# Patient Record
Sex: Male | Born: 2000 | Race: Black or African American | Hispanic: No | Marital: Single | Smoking: Current every day smoker
Health system: Southern US, Community
[De-identification: ages and names within clinical notes are randomized; demographics above are authoritative.]

## PROBLEM LIST (undated history)

## (undated) DIAGNOSIS — S21331A Puncture wound without foreign body of right front wall of thorax with penetration into thoracic cavity, initial encounter: Secondary | ICD-10-CM

## (undated) DIAGNOSIS — F909 Attention-deficit hyperactivity disorder, unspecified type: Secondary | ICD-10-CM

## (undated) DIAGNOSIS — A4901 Methicillin susceptible Staphylococcus aureus infection, unspecified site: Secondary | ICD-10-CM

## (undated) HISTORY — DX: Puncture wound without foreign body of right front wall of thorax with penetration into thoracic cavity, initial encounter: S21.331A

## (undated) HISTORY — DX: Methicillin susceptible Staphylococcus aureus infection, unspecified site: A49.01

## (undated) HISTORY — PX: ADENOIDECTOMY: SUR15

---
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
## 2018-03-01 DIAGNOSIS — Z5181 Encounter for therapeutic drug level monitoring: Secondary | ICD-10-CM | POA: Diagnosis not present

## 2018-03-03 DIAGNOSIS — Z5181 Encounter for therapeutic drug level monitoring: Secondary | ICD-10-CM | POA: Diagnosis not present

## 2018-03-08 DIAGNOSIS — Z5181 Encounter for therapeutic drug level monitoring: Secondary | ICD-10-CM | POA: Diagnosis not present

## 2018-03-10 DIAGNOSIS — Z5181 Encounter for therapeutic drug level monitoring: Secondary | ICD-10-CM | POA: Diagnosis not present

## 2018-03-16 DIAGNOSIS — Z5181 Encounter for therapeutic drug level monitoring: Secondary | ICD-10-CM | POA: Diagnosis not present

## 2018-03-18 DIAGNOSIS — Z5181 Encounter for therapeutic drug level monitoring: Secondary | ICD-10-CM | POA: Diagnosis not present

## 2018-03-22 DIAGNOSIS — Z5181 Encounter for therapeutic drug level monitoring: Secondary | ICD-10-CM | POA: Diagnosis not present

## 2018-03-24 DIAGNOSIS — Z5181 Encounter for therapeutic drug level monitoring: Secondary | ICD-10-CM | POA: Diagnosis not present

## 2018-03-30 DIAGNOSIS — Z5181 Encounter for therapeutic drug level monitoring: Secondary | ICD-10-CM | POA: Diagnosis not present

## 2018-04-01 DIAGNOSIS — Z5181 Encounter for therapeutic drug level monitoring: Secondary | ICD-10-CM | POA: Diagnosis not present

## 2018-04-05 DIAGNOSIS — Z5181 Encounter for therapeutic drug level monitoring: Secondary | ICD-10-CM | POA: Diagnosis not present

## 2018-04-06 IMAGING — CR DG HAND COMPLETE 3+V*R*
3 series · 3 of 3 positions shown · non-contrast
Comparison: None.

CLINICAL DATA: Pain in the right hand, fifth metacarpal region

EXAM:
RIGHT HAND - COMPLETE 3+ VIEW

[x hand pa left]
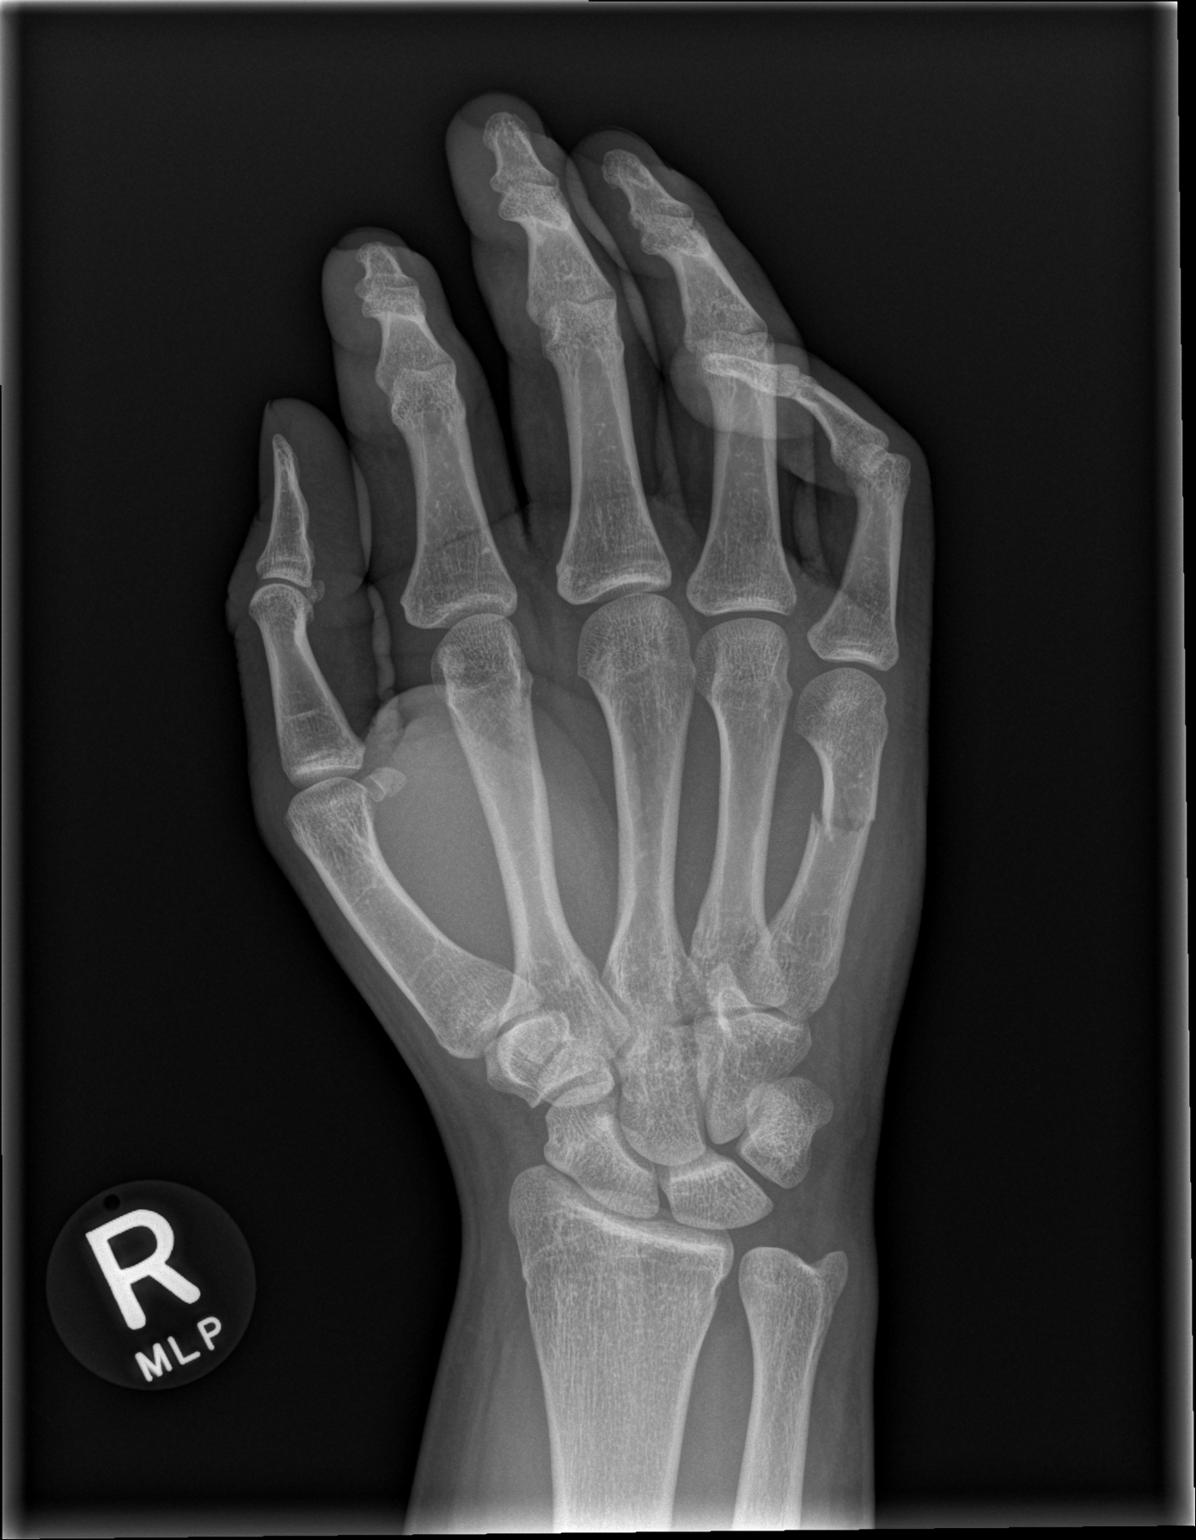

[x hand obl left]
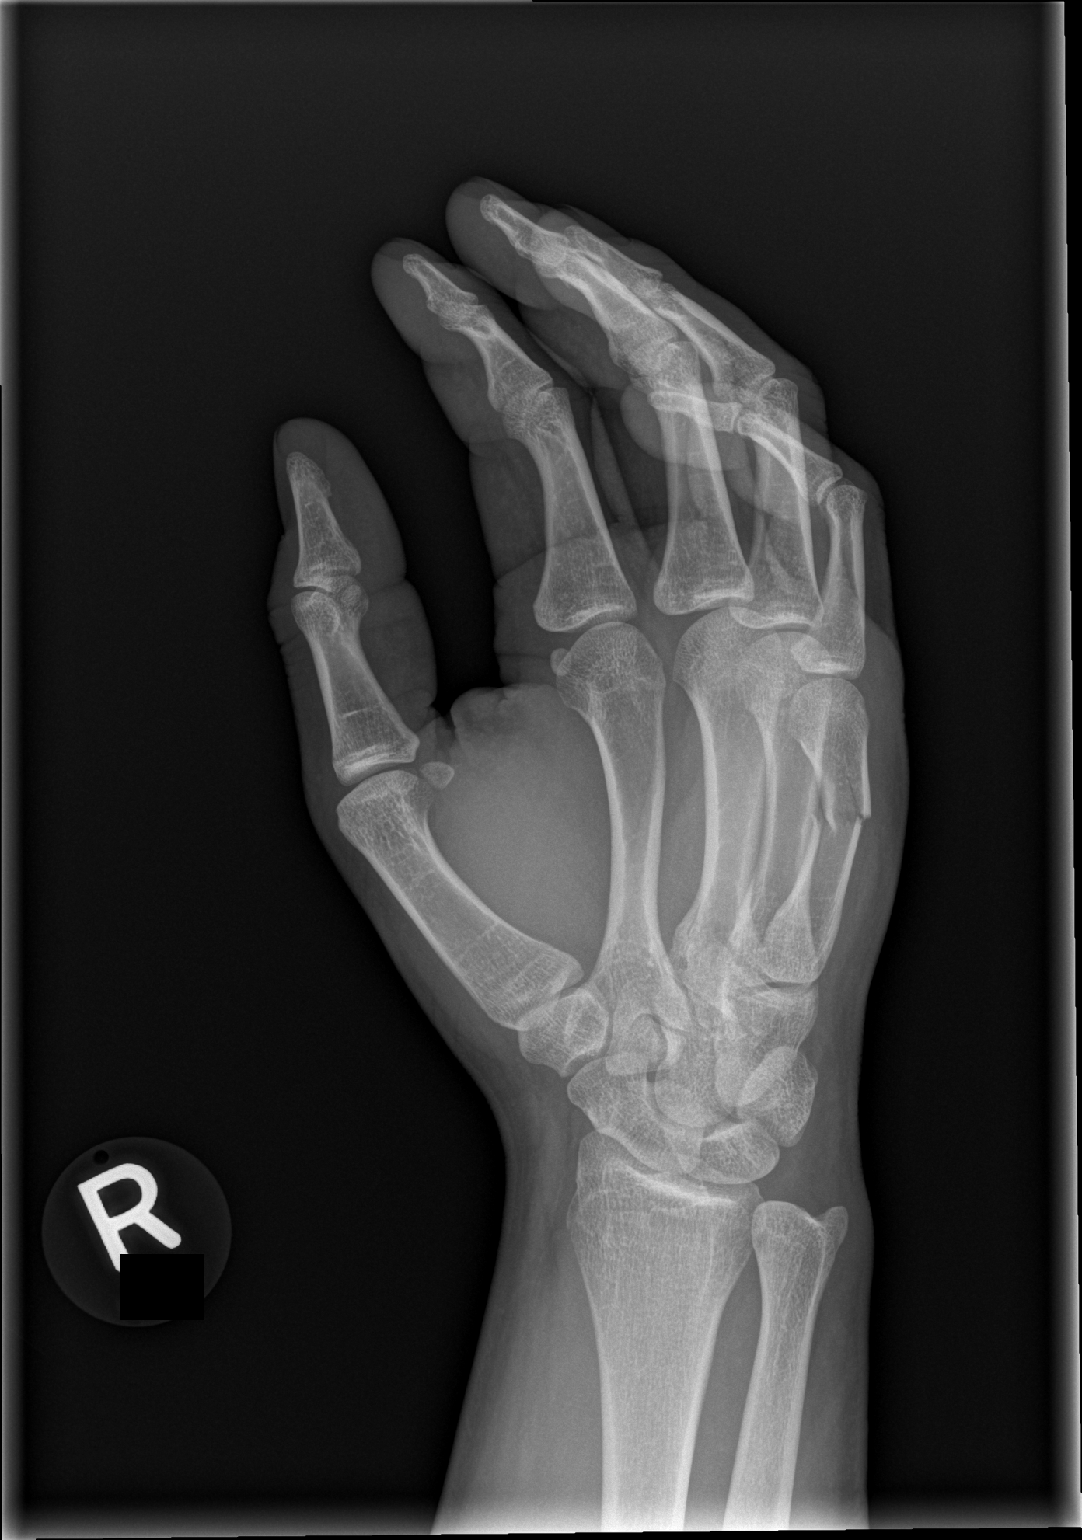

[x hand lat left]
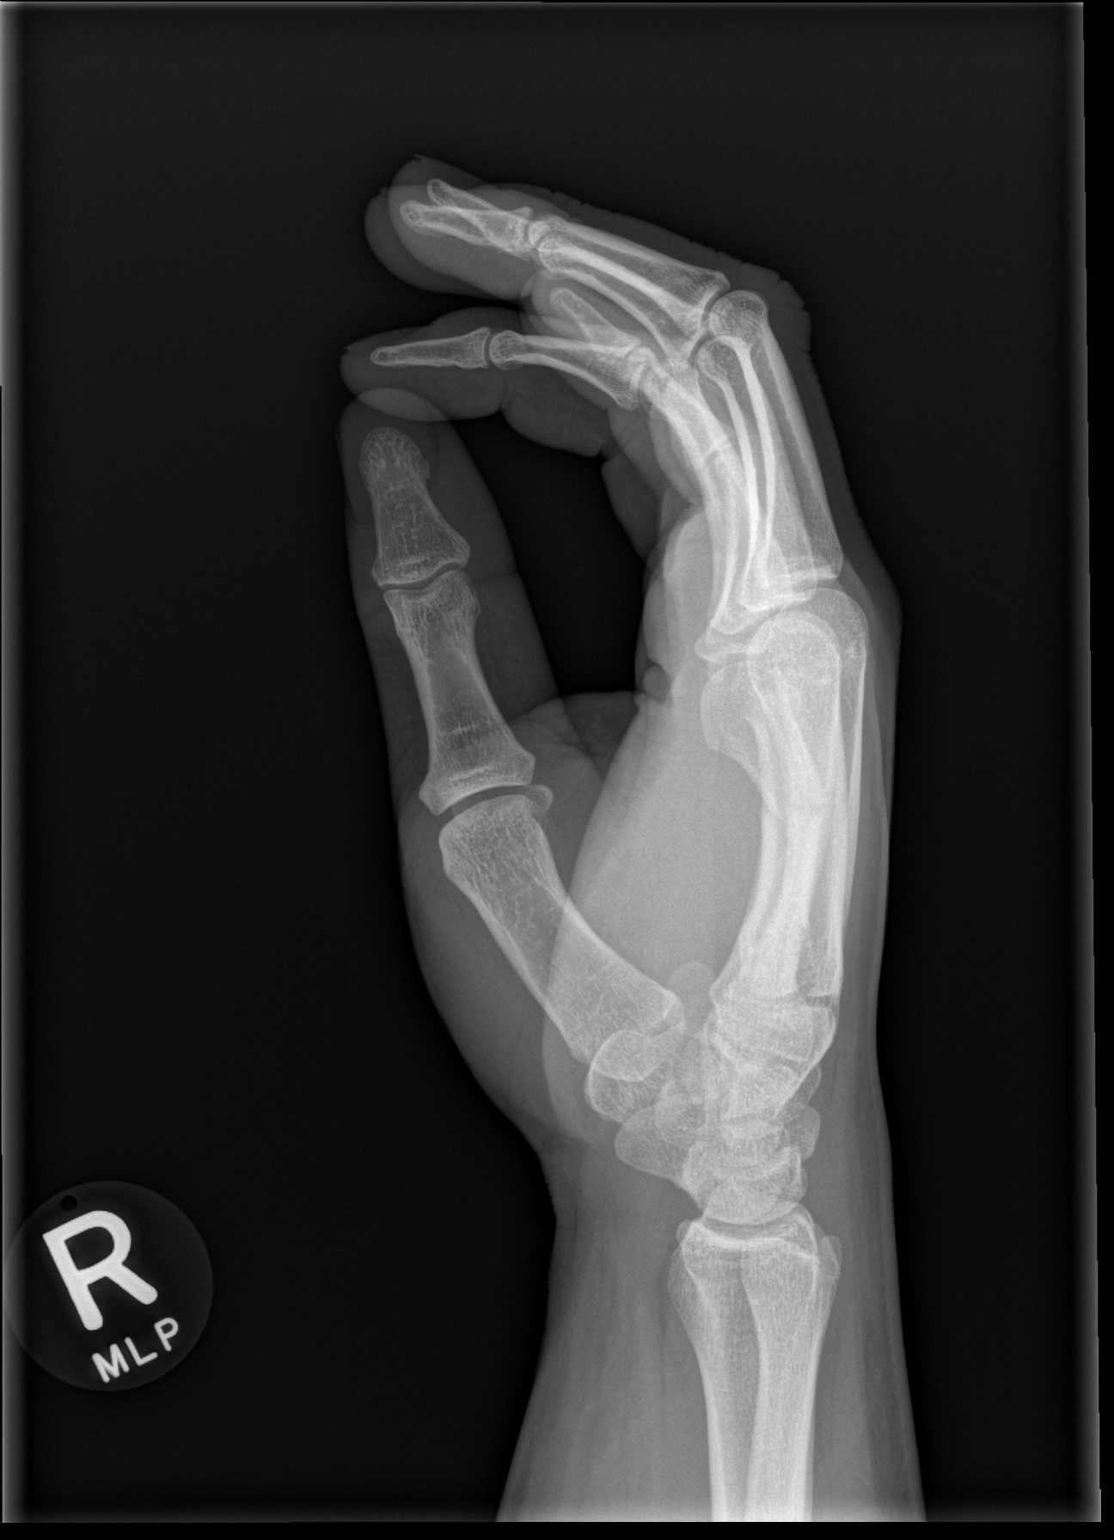

[3 of 3 positions shown; findings below may reference images not displayed]

FINDINGS: There is an acute fracture involving the midshaft of the fifth
metacarpal. This demonstrates mild volar angulation of the distal
fracture fragment. Less than [DATE] shaft diameter of displacement of
distal fracture fragment in an ulnar direction.
IMPRESSION: Mildly displaced and angulated fracture involving the midshaft of
the fifth metacarpal

## 2018-04-07 DIAGNOSIS — Z5181 Encounter for therapeutic drug level monitoring: Secondary | ICD-10-CM | POA: Diagnosis not present

## 2018-04-12 DIAGNOSIS — Z5181 Encounter for therapeutic drug level monitoring: Secondary | ICD-10-CM | POA: Diagnosis not present

## 2018-04-14 DIAGNOSIS — Z5181 Encounter for therapeutic drug level monitoring: Secondary | ICD-10-CM | POA: Diagnosis not present

## 2018-04-19 DIAGNOSIS — Z5181 Encounter for therapeutic drug level monitoring: Secondary | ICD-10-CM | POA: Diagnosis not present

## 2018-04-21 DIAGNOSIS — Z5181 Encounter for therapeutic drug level monitoring: Secondary | ICD-10-CM | POA: Diagnosis not present

## 2018-04-26 DIAGNOSIS — Z5181 Encounter for therapeutic drug level monitoring: Secondary | ICD-10-CM | POA: Diagnosis not present

## 2018-04-28 DIAGNOSIS — Z5181 Encounter for therapeutic drug level monitoring: Secondary | ICD-10-CM | POA: Diagnosis not present

## 2018-05-17 DIAGNOSIS — Z5181 Encounter for therapeutic drug level monitoring: Secondary | ICD-10-CM | POA: Diagnosis not present

## 2018-05-19 DIAGNOSIS — Z5181 Encounter for therapeutic drug level monitoring: Secondary | ICD-10-CM | POA: Diagnosis not present

## 2018-07-07 DIAGNOSIS — J329 Chronic sinusitis, unspecified: Secondary | ICD-10-CM | POA: Diagnosis not present

## 2018-07-07 DIAGNOSIS — Z113 Encounter for screening for infections with a predominantly sexual mode of transmission: Secondary | ICD-10-CM | POA: Diagnosis not present

## 2018-07-07 DIAGNOSIS — F8081 Childhood onset fluency disorder: Secondary | ICD-10-CM | POA: Diagnosis not present

## 2018-07-15 ENCOUNTER — Ambulatory Visit: Payer: Medicaid Other | Attending: Pediatrics | Admitting: Speech Pathology

## 2018-09-04 DIAGNOSIS — H1013 Acute atopic conjunctivitis, bilateral: Secondary | ICD-10-CM | POA: Diagnosis not present

## 2019-06-27 ENCOUNTER — Ambulatory Visit (INDEPENDENT_AMBULATORY_CARE_PROVIDER_SITE_OTHER): Payer: Medicaid Other

## 2019-06-27 ENCOUNTER — Ambulatory Visit (HOSPITAL_COMMUNITY)
Admission: EM | Admit: 2019-06-27 | Discharge: 2019-06-27 | Disposition: A | Discharge status: Home / Self Care | Payer: Medicaid Other | Attending: Family Medicine | Admitting: Family Medicine

## 2019-06-27 ENCOUNTER — Encounter (HOSPITAL_COMMUNITY): Payer: Self-pay

## 2019-06-27 ENCOUNTER — Other Ambulatory Visit: Payer: Self-pay

## 2019-06-27 DIAGNOSIS — M25511 Pain in right shoulder: Secondary | ICD-10-CM

## 2019-06-27 NOTE — ED Provider Notes (Signed)
[facility]    CSN: [id number] Arrival date & time: 06/27/19  1939      History   Chief Complaint Chief Complaint  Patient presents with  . Shoulder Pain    HPI Chad Davidson is a 18 y.o. male.   He is presenting with right shoulder pain.  This is been ongoing for the past 3 days.  It got acutely worse over the past 24 hours.  He was tackling a treat in October.  He denies any dislocation.  He had full range of motion yesterday.  He has some trouble changing the brakes yesterday.  The pain is occurring over the anterior aspect of the shoulder.  No radicular symptoms.  HPI  Past Medical History:  Diagnosis Date  . Attention deficit hyperactivity disorder (ADHD)     Patient Active Problem List   Diagnosis Date Noted  . Boxer's fracture, closed, initial encounter 03/12/2017  . 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 Mandible fracture ([place]) 04/15/2013    Past Surgical History:  Procedure Laterality Date  . ADENOIDECTOMY     age 23  . CLOSED REDUCTION MANDIBLE WITH MANDIBULOMA N/A 04/15/2013   Procedure: CLOSED REDUCTION MANDIBLE WITH MANDIBULOMAXILLARY FUSION;  Surgeon: Izora Gala, MD;  Location: [facility];  Service: ENT;  Laterality: N/A;  . MANDIBULAR HARDWARE REMOVAL N/A 05/25/2013   Procedure: MANDIBULAR HARDWARE REMOVAL;  Surgeon: Izora Gala, MD;  Location: [facility];  Service: ENT;  Laterality: N/A;       Home Medications    Prior to Admission medications   Medication Sig Start Date End Date Taking? Authorizing Provider  ALPRAZolam Duanne Moron) 0.5 MG tablet Take 2 tablets approximately 45 minutes prior to the MRI study, take a third tablet if needed. 03/02/17   Kathrynn Ducking, MD  amphetamine-dextroamphetamine (ADDERALL XR) 30 MG 24 hr capsule Take 30 mg by mouth every morning.    [provider]  amphetamine-dextroamphetamine (ADDERALL) 10 MG tablet Take 10 mg by mouth daily.    [provider]  ibuprofen  (ADVIL,MOTRIN) 800 MG tablet Take 1 tablet (800 mg total) by mouth every 8 (eight) hours as needed. 03/09/17   Dalia Heading, PA-C    Family History Family History  Problem Relation Age of Onset  . Diabetes Mother   . Narcolepsy Mother   . Asthma Sister   . Hypertension Maternal Grandmother     Social History Social History   Tobacco Use  . Smoking status: Never Smoker  . Smokeless tobacco: Never Used  Substance Use Topics  . Alcohol use: No  . Drug use: Yes    Types: Marijuana    Comment: Used once since 02/18/17     Allergies   Patient has no known allergies.   Review of Systems Review of Systems  Constitutional: Negative for fever.  HENT: Negative for congestion.   Respiratory: Negative for cough.   Cardiovascular: Negative for chest pain.  Gastrointestinal: Negative for abdominal pain.  Musculoskeletal: Negative for back pain.  Neurological: Negative for weakness.  Hematological: Negative for adenopathy.  Psychiatric/Behavioral: Negative for agitation.     Physical Exam Triage Vital Signs ED Triage Vitals  Enc Vitals Group     BP 06/27/19 2001 (!) 157/78     Pulse Rate 06/27/19 2001 96     Resp 06/27/19 2001 18     Temp 06/27/19 2001 97.8 F (36.6 C)     Temp Source 06/27/19 2001 Oral     SpO2 06/27/19 2001 98 %  Weight 06/27/19 2003 230 lb (104.3 kg)     Height --      Head Circumference --      Peak Flow --      Pain Score --      Pain Loc --      Pain Edu? --      Excl. in GC? --    No data found.  Updated Vital Signs BP (!) 157/78 (BP Location: Right Arm)   Pulse 96   Temp 97.8 F (36.6 C) (Oral)   Resp 18   Wt 104.3 kg   SpO2 98%   BMI 32.08 kg/m   Visual Acuity Right Eye Distance:   Left Eye Distance:   Bilateral Distance:    Right Eye Near:   Left Eye Near:    Bilateral Near:     Physical Exam Gen: NAD, alert, cooperative with exam, well-appearing ENT: normal lips, normal nasal mucosa,  Eye: normal EOM, normal  conjunctiva and lids CV:  no edema, +2 pedal pulses   Resp: no accessory muscle use, non-labored,  GI: no masses or tenderness, no hernia  Skin: no rashes, no areas of induration  Neuro: normal tone, normal sensation to touch Psych:  normal insight, alert and oriented MSK:  Right shoulder:  Normal passive flexion and abduction  Normal IR and ER  TTP over the clavicle  Pain with ER and abduction  Pain with Empty can testing  NVI     UC Treatments / Results  Labs (all labs ordered are listed, but only abnormal results are displayed) Labs Reviewed - No data to display  EKG   Radiology Dg Shoulder Right  Result Date: 06/27/2019 CLINICAL DATA:  18 year old male with right shoulder pain. EXAM: RIGHT SHOULDER - 2+ VIEW COMPARISON:  None. FINDINGS: There is no acute fracture or dislocation. The bones are well mineralized. No significant degenerative changes. Faint tiny lucencies in the right shoulder joint noted which may be related to recent injection or represent cartilaginous degeneration. MRI may provide better evaluation if clinically indicated. IMPRESSION: 1. No acute fracture or dislocation. 2. Tiny lucencies in the right shoulder joint may be related to recent injection or represent cartilaginous degeneration. Clinical correlation is recommended. Electronically Signed   By: Elgie Collard M.D.   On: 06/27/2019 20:52    Procedures Procedures (including critical care time)  Medications Ordered in UC Medications - No data to display  Initial Impression / Assessment and Plan / UC Course  I have reviewed the triage vital signs and the nursing notes.  Pertinent labs & imaging results that were available during my care of the patient were reviewed by me and considered in my medical decision making (see chart for details).     [name] is a 18 yo M that is presenting with right shoulder pain. Concern for labral issue with tackling a tree. Less likely for dislocation. Imaging  showed no fracture. Will place in sling. Counseled on supportive care and given indications to follow up.   Final Clinical Impressions(s) / UC Diagnoses   Final diagnoses:  Acute pain of right shoulder     Discharge Instructions     Please try the sling  Please try ibuprofen for pain  Please use ice Please follow up with sports medicine     ED Prescriptions    None     PDMP not reviewed this encounter.   Myra Rude, MD 06/27/19 2112

## 2019-06-27 NOTE — ED Triage Notes (Signed)
Pt states he push a tree down today. Pt was outside today pushing down  a old rotten tree today and injured his right shoulder.

## 2019-06-27 NOTE — Discharge Instructions (Signed)
Please try the sling  Please try ibuprofen for pain  Please use ice Please follow up with sports medicine

## 2019-08-19 ENCOUNTER — Emergency Department (HOSPITAL_COMMUNITY): Payer: Medicaid Other

## 2019-08-19 ENCOUNTER — Encounter (HOSPITAL_COMMUNITY): Payer: Self-pay

## 2019-08-19 ENCOUNTER — Emergency Department (HOSPITAL_COMMUNITY)
Admission: EM | Admit: 2019-08-19 | Discharge: 2019-08-19 | Disposition: A | Discharge status: Home / Self Care | Payer: Medicaid Other | Attending: Emergency Medicine | Admitting: Emergency Medicine

## 2019-08-19 ENCOUNTER — Other Ambulatory Visit: Payer: Self-pay

## 2019-08-19 DIAGNOSIS — S61001A Unspecified open wound of right thumb without damage to nail, initial encounter: Secondary | ICD-10-CM | POA: Diagnosis not present

## 2019-08-19 DIAGNOSIS — Y939 Activity, unspecified: Secondary | ICD-10-CM | POA: Diagnosis not present

## 2019-08-19 DIAGNOSIS — S81002A Unspecified open wound, left knee, initial encounter: Secondary | ICD-10-CM | POA: Diagnosis not present

## 2019-08-19 DIAGNOSIS — Y999 Unspecified external cause status: Secondary | ICD-10-CM | POA: Diagnosis not present

## 2019-08-19 DIAGNOSIS — T07XXXA Unspecified multiple injuries, initial encounter: Secondary | ICD-10-CM | POA: Insufficient documentation

## 2019-08-19 DIAGNOSIS — S299XXA Unspecified injury of thorax, initial encounter: Secondary | ICD-10-CM | POA: Diagnosis not present

## 2019-08-19 DIAGNOSIS — Y929 Unspecified place or not applicable: Secondary | ICD-10-CM | POA: Insufficient documentation

## 2019-08-19 DIAGNOSIS — W3400XA Accidental discharge from unspecified firearms or gun, initial encounter: Secondary | ICD-10-CM | POA: Insufficient documentation

## 2019-08-19 DIAGNOSIS — F17210 Nicotine dependence, cigarettes, uncomplicated: Secondary | ICD-10-CM | POA: Diagnosis not present

## 2019-08-19 DIAGNOSIS — F1210 Cannabis abuse, uncomplicated: Secondary | ICD-10-CM | POA: Diagnosis not present

## 2019-08-19 DIAGNOSIS — Z23 Encounter for immunization: Secondary | ICD-10-CM | POA: Insufficient documentation

## 2019-08-19 DIAGNOSIS — S60351A Superficial foreign body of right thumb, initial encounter: Secondary | ICD-10-CM | POA: Insufficient documentation

## 2019-08-19 DIAGNOSIS — S61432A Puncture wound without foreign body of left hand, initial encounter: Secondary | ICD-10-CM | POA: Diagnosis not present

## 2019-08-19 DIAGNOSIS — S21131A Puncture wound without foreign body of right front wall of thorax without penetration into thoracic cavity, initial encounter: Secondary | ICD-10-CM | POA: Diagnosis not present

## 2019-08-19 DIAGNOSIS — S81032A Puncture wound without foreign body, left knee, initial encounter: Secondary | ICD-10-CM | POA: Diagnosis not present

## 2019-08-19 MED ORDER — TETANUS-DIPHTH-ACELL PERTUSSIS 5-2.5-18.5 LF-MCG/0.5 IM SUSP
0.5000 mL | Freq: Once | INTRAMUSCULAR | Status: AC
  Administered 2019-08-19: 22:00:00 0.5 mL via INTRAMUSCULAR
  Filled 2019-08-19: qty 0.5

## 2019-08-19 MED ORDER — CEPHALEXIN 500 MG PO CAPS: 500.0000 mg | ORAL_CAPSULE | Freq: Four times a day (QID) | ORAL | 0 refills | Status: DC

## 2019-08-19 MED ORDER — IBUPROFEN 800 MG PO TABS: 800.0000 mg | ORAL_TABLET | Freq: Three times a day (TID) | ORAL | 0 refills | Status: DC | PRN
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
## 2019-08-19 NOTE — ED Notes (Addendum)
[employer] at bedside 

## 2019-08-19 NOTE — ED Notes (Signed)
Pt was verbalized discharge instructions. PT had no further questions at this time. NAD. 

## 2019-09-16 ENCOUNTER — Encounter (HOSPITAL_COMMUNITY): Payer: Self-pay | Admitting: Emergency Medicine

## 2019-09-16 ENCOUNTER — Other Ambulatory Visit: Payer: Self-pay

## 2019-09-16 ENCOUNTER — Inpatient Hospital Stay (HOSPITAL_COMMUNITY)
Admission: EM | Admit: 2019-09-16 | Discharge: 2019-09-19 | DRG: 541 | Disposition: A | Discharge status: Home Health | Payer: Medicaid Other | Attending: Internal Medicine | Admitting: Internal Medicine

## 2019-09-16 ENCOUNTER — Emergency Department (HOSPITAL_COMMUNITY): Payer: Medicaid Other

## 2019-09-16 DIAGNOSIS — M795 Residual foreign body in soft tissue: Secondary | ICD-10-CM | POA: Diagnosis not present

## 2019-09-16 DIAGNOSIS — F17200 Nicotine dependence, unspecified, uncomplicated: Secondary | ICD-10-CM

## 2019-09-16 DIAGNOSIS — F17290 Nicotine dependence, other tobacco product, uncomplicated: Secondary | ICD-10-CM | POA: Diagnosis present

## 2019-09-16 DIAGNOSIS — M869 Osteomyelitis, unspecified: Principal | ICD-10-CM | POA: Diagnosis present

## 2019-09-16 DIAGNOSIS — W3400XA Accidental discharge from unspecified firearms or gun, initial encounter: Secondary | ICD-10-CM

## 2019-09-16 DIAGNOSIS — F909 Attention-deficit hyperactivity disorder, unspecified type: Secondary | ICD-10-CM | POA: Diagnosis present

## 2019-09-16 DIAGNOSIS — B9561 Methicillin susceptible Staphylococcus aureus infection as the cause of diseases classified elsewhere: Secondary | ICD-10-CM | POA: Diagnosis present

## 2019-09-16 DIAGNOSIS — S6991XA Unspecified injury of right wrist, hand and finger(s), initial encounter: Secondary | ICD-10-CM | POA: Diagnosis not present

## 2019-09-16 DIAGNOSIS — A4901 Methicillin susceptible Staphylococcus aureus infection, unspecified site: Secondary | ICD-10-CM

## 2019-09-16 DIAGNOSIS — S61101A Unspecified open wound of right thumb with damage to nail, initial encounter: Secondary | ICD-10-CM | POA: Diagnosis not present

## 2019-09-16 DIAGNOSIS — S21331A Puncture wound without foreign body of right front wall of thorax with penetration into thoracic cavity, initial encounter: Secondary | ICD-10-CM

## 2019-09-16 DIAGNOSIS — Z03818 Encounter for observation for suspected exposure to other biological agents ruled out: Secondary | ICD-10-CM | POA: Diagnosis not present

## 2019-09-16 DIAGNOSIS — M868X4 Other osteomyelitis, hand: Secondary | ICD-10-CM | POA: Diagnosis not present

## 2019-09-16 DIAGNOSIS — L98499 Non-pressure chronic ulcer of skin of other sites with unspecified severity: Secondary | ICD-10-CM | POA: Diagnosis present

## 2019-09-16 DIAGNOSIS — Z20822 Contact with and (suspected) exposure to covid-19: Secondary | ICD-10-CM | POA: Diagnosis present

## 2019-09-16 HISTORY — DX: Osteomyelitis, unspecified: M86.9

## 2019-09-16 LAB — CBC WITH DIFFERENTIAL/PLATELET
Abs Immature Granulocytes: 0.02 10*3/uL (ref 0.00–0.07)
Basophils Absolute: 0 10*3/uL (ref 0.0–0.1)
Basophils Relative: 1 %
Eosinophils Absolute: 0.2 10*3/uL (ref 0.0–0.5)
Eosinophils Relative: 2 %
HCT: 41.9 % (ref 39.0–52.0)
Hemoglobin: 13.6 g/dL (ref 13.0–17.0)
Immature Granulocytes: 0 %
Lymphocytes Relative: 45 %
Lymphs Abs: 3.6 10*3/uL (ref 0.7–4.0)
MCH: 26.7 pg (ref 26.0–34.0)
MCHC: 32.5 g/dL (ref 30.0–36.0)
MCV: 82.2 fL (ref 80.0–100.0)
Monocytes Absolute: 0.7 10*3/uL (ref 0.1–1.0)
Monocytes Relative: 9 %
Neutro Abs: 3.4 10*3/uL (ref 1.7–7.7)
Neutrophils Relative %: 43 %
Platelets: 265 10*3/uL (ref 150–400)
RBC: 5.1 MIL/uL (ref 4.22–5.81)
RDW: 14.1 % (ref 11.5–15.5)
WBC: 7.9 10*3/uL (ref 4.0–10.5)
nRBC: 0 % (ref 0.0–0.2)

## 2019-09-16 LAB — COMPREHENSIVE METABOLIC PANEL
ALT: 19 U/L (ref 0–44)
AST: 20 U/L (ref 15–41)
Albumin: 4.8 g/dL (ref 3.5–5.0)
Alkaline Phosphatase: 89 U/L (ref 38–126)
Anion gap: 11 (ref 5–15)
BUN: 9 mg/dL (ref 6–20)
CO2: 23 mmol/L (ref 22–32)
Calcium: 10.1 mg/dL (ref 8.9–10.3)
Chloride: 102 mmol/L (ref 98–111)
Creatinine, Ser: 0.84 mg/dL (ref 0.61–1.24)
GFR calc Af Amer: >60 mL/min (ref >60)
GFR calc non Af Amer: >60 mL/min (ref >60)
Glucose, Bld: 91 mg/dL (ref 70–99)
Potassium: 3.7 mmol/L (ref 3.5–5.1)
Sodium: 136 mmol/L (ref 135–145)
Total Bilirubin: 0.4 mg/dL (ref 0.3–1.2)
Total Protein: 8.3 g/dL — ABNORMAL HIGH (ref 6.5–8.1)

## 2019-09-16 LAB — BASIC METABOLIC PANEL
Anion gap: 12 (ref 5–15)
BUN: 9 mg/dL (ref 6–20)
CO2: 22 mmol/L (ref 22–32)
Calcium: 9.7 mg/dL (ref 8.9–10.3)
Chloride: 102 mmol/L (ref 98–111)
Creatinine, Ser: 0.84 mg/dL (ref 0.61–1.24)
GFR calc Af Amer: >60 mL/min (ref >60)
GFR calc non Af Amer: >60 mL/min (ref >60)
Glucose, Bld: 105 mg/dL — ABNORMAL HIGH (ref 70–99)
Potassium: 4.1 mmol/L (ref 3.5–5.1)
Sodium: 136 mmol/L (ref 135–145)

## 2019-09-16 LAB — C-REACTIVE PROTEIN: CRP: 1.3 mg/dL — ABNORMAL HIGH (ref <1.0)

## 2019-09-16 LAB — SEDIMENTATION RATE: Sed Rate: 8 mm/hr (ref 0–16)

## 2019-09-16 MED ORDER — ONDANSETRON HCL 4 MG/2ML IJ SOLN: 4.0000 mg | Freq: Four times a day (QID) | INTRAMUSCULAR | Status: DC | PRN

## 2019-09-16 MED ORDER — VANCOMYCIN HCL 1500 MG/300ML IV SOLN
1500.0000 mg | Freq: Two times a day (BID) | INTRAVENOUS | Status: DC
  Administered 2019-09-17 – 2019-09-19 (×5): 1500 mg via INTRAVENOUS
  Filled 2019-09-16 (×7): qty 300

## 2019-09-16 MED ORDER — MORPHINE SULFATE (PF) 4 MG/ML IV SOLN: 4.0000 mg | Freq: Once | INTRAVENOUS | Status: DC | PRN

## 2019-09-16 MED ORDER — MORPHINE SULFATE (PF) 4 MG/ML IV SOLN
4.0000 mg | Freq: Once | INTRAVENOUS | Status: AC
  Administered 2019-09-16: 4 mg via INTRAVENOUS
  Filled 2019-09-16: qty 1

## 2019-09-16 MED ORDER — ONDANSETRON HCL 4 MG PO TABS: 4.0000 mg | ORAL_TABLET | Freq: Four times a day (QID) | ORAL | Status: DC | PRN

## 2019-09-16 MED ORDER — ACETAMINOPHEN 650 MG RE SUPP: 650.0000 mg | Freq: Four times a day (QID) | RECTAL | Status: DC | PRN

## 2019-09-16 MED ORDER — ENOXAPARIN SODIUM 40 MG/0.4ML ~~LOC~~ SOLN
40.0000 mg | Freq: Every day | SUBCUTANEOUS | Status: DC
  Administered 2019-09-16: 40 mg via SUBCUTANEOUS
  Filled 2019-09-16 (×3): qty 0.4

## 2019-09-16 MED ORDER — SODIUM CHLORIDE 0.9 % IV BOLUS
500.0000 mL | Freq: Once | INTRAVENOUS | Status: AC
  Administered 2019-09-16: 500 mL via INTRAVENOUS

## 2019-09-16 MED ORDER — ACETAMINOPHEN 325 MG PO TABS: 650.0000 mg | ORAL_TABLET | Freq: Four times a day (QID) | ORAL | Status: DC | PRN

## 2019-09-16 MED ORDER — LIDOCAINE HCL (PF) 1 % IJ SOLN
5.0000 mL | Freq: Once | INTRAMUSCULAR | Status: AC
  Administered 2019-09-16: 22:00:00 5 mL
  Filled 2019-09-16: qty 30

## 2019-09-16 MED ORDER — SODIUM CHLORIDE 0.9 % IV SOLN
2.0000 g | Freq: Once | INTRAVENOUS | Status: AC
  Administered 2019-09-16: 2 g via INTRAVENOUS
  Filled 2019-09-16: qty 20

## 2019-09-16 MED ORDER — BUPIVACAINE HCL (PF) 0.5 % IJ SOLN
5.0000 mL | Freq: Once | INTRAMUSCULAR | Status: AC
  Administered 2019-09-16: 22:00:00 5 mL
  Filled 2019-09-16: qty 30

## 2019-09-16 MED ORDER — SODIUM CHLORIDE 0.9 % IV SOLN
2.0000 g | INTRAVENOUS | Status: DC
  Administered 2019-09-17: 2 g via INTRAVENOUS
  Filled 2019-09-16: qty 2

## 2019-09-16 MED ORDER — VANCOMYCIN HCL 2000 MG/400ML IV SOLN
2000.0000 mg | Freq: Once | INTRAVENOUS | Status: AC
  Administered 2019-09-16: 2000 mg via INTRAVENOUS
  Filled 2019-09-16: qty 400

## 2019-09-16 MED ORDER — TRAMADOL HCL 50 MG PO TABS
50.0000 mg | ORAL_TABLET | Freq: Four times a day (QID) | ORAL | Status: DC | PRN
  Administered 2019-09-16 – 2019-09-17 (×2): 50 mg via ORAL
  Filled 2019-09-16 (×2): qty 1

## 2019-09-16 NOTE — Progress Notes (Signed)
A consult was received from an ED physician for vanc per pharmacy dosing.  The patient's profile has been reviewed for ht/wt/allergies/indication/available labs.   A one time order has been placed for vanc 2g.  Further antibiotics/pharmacy consults should be ordered by admitting physician if indicated.                       Thank you, Berkley Harvey 09/16/2019  9:15 PM
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
## 2019-09-16 NOTE — Progress Notes (Signed)
Called by EDP, asked for advice regarding this patient's thumb condition.  I reviewed the hx, clinical photos, and xrays and recommended nail plate removal/debridement of region of involvement, obtaining cultures, and conferring with ID or patient's PCP to initiate appropriate medical management for the possible adjacent osteo.    I will plan to see him back this week for clinical re-evaluation from surgical perspective.  Neil Crouch, MD Hand Surgery Mobile [phone]
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
## 2019-09-16 NOTE — Progress Notes (Signed)
Pharmacy Antibiotic Note  Chad Davidson is a 19 y.o. male admitted on 09/16/2019 with Osteomyelitis.  Pharmacy has been consulted for Vancomycin dosing.  Plan: Vancomycin 2gm iv x1, then Vancomycin 1500 mg IV Q 12 hrs. Goal AUC 400-550. Expected AUC: 468 SCr used: 0.84 Vd: 0.5 L/kg (BMI > 30)   Height: 5\' 11"  (180.3 cm) Weight: 220 lb (99.8 kg) IBW/kg (Calculated) : 75.3  Temp (24hrs), Avg:98.5 F (36.9 C), Min:98.3 F (36.8 C), Max:98.7 F (37.1 C)  Recent Labs  Lab 09/16/19 1827 09/16/19 2304  WBC 7.9  --   CREATININE 0.84 0.84    Estimated Creatinine Clearance: 168.8 mL/min (by C-G formula based on SCr of 0.84 mg/dL).    No Known Allergies  Antimicrobials this admission: Vancomycin 09/16/2019 >> Ceftriaxone 09/16/2019 >>   Dose adjustments this admission: -  Microbiology results: -  Thank you for allowing pharmacy to be a part of this patient's care.  09/18/2019 Chad Davidson 09/16/2019 11:41 PM

## 2019-09-17 ENCOUNTER — Observation Stay: Payer: Self-pay

## 2019-09-17 DIAGNOSIS — L98499 Non-pressure chronic ulcer of skin of other sites with unspecified severity: Secondary | ICD-10-CM | POA: Diagnosis present

## 2019-09-17 DIAGNOSIS — M869 Osteomyelitis, unspecified: Secondary | ICD-10-CM | POA: Diagnosis not present

## 2019-09-17 DIAGNOSIS — S61101A Unspecified open wound of right thumb with damage to nail, initial encounter: Secondary | ICD-10-CM | POA: Diagnosis not present

## 2019-09-17 DIAGNOSIS — M795 Residual foreign body in soft tissue: Secondary | ICD-10-CM | POA: Diagnosis not present

## 2019-09-17 DIAGNOSIS — M89541 Osteolysis, right hand: Secondary | ICD-10-CM | POA: Diagnosis not present

## 2019-09-17 DIAGNOSIS — F17290 Nicotine dependence, other tobacco product, uncomplicated: Secondary | ICD-10-CM | POA: Diagnosis present

## 2019-09-17 DIAGNOSIS — W3400XA Accidental discharge from unspecified firearms or gun, initial encounter: Secondary | ICD-10-CM

## 2019-09-17 DIAGNOSIS — F909 Attention-deficit hyperactivity disorder, unspecified type: Secondary | ICD-10-CM

## 2019-09-17 DIAGNOSIS — Z03818 Encounter for observation for suspected exposure to other biological agents ruled out: Secondary | ICD-10-CM | POA: Diagnosis not present

## 2019-09-17 DIAGNOSIS — M868X4 Other osteomyelitis, hand: Secondary | ICD-10-CM | POA: Diagnosis not present

## 2019-09-17 DIAGNOSIS — S21331A Puncture wound without foreign body of right front wall of thorax with penetration into thoracic cavity, initial encounter: Secondary | ICD-10-CM

## 2019-09-17 DIAGNOSIS — B9561 Methicillin susceptible Staphylococcus aureus infection as the cause of diseases classified elsewhere: Secondary | ICD-10-CM | POA: Diagnosis not present

## 2019-09-17 DIAGNOSIS — S6991XA Unspecified injury of right wrist, hand and finger(s), initial encounter: Secondary | ICD-10-CM | POA: Diagnosis not present

## 2019-09-17 DIAGNOSIS — F17200 Nicotine dependence, unspecified, uncomplicated: Secondary | ICD-10-CM

## 2019-09-17 DIAGNOSIS — Z20822 Contact with and (suspected) exposure to covid-19: Secondary | ICD-10-CM | POA: Diagnosis present

## 2019-09-17 DIAGNOSIS — W3409XA Accidental discharge from other specified firearms, initial encounter: Secondary | ICD-10-CM | POA: Diagnosis not present

## 2019-09-17 DIAGNOSIS — S61339A Puncture wound without foreign body of unspecified finger with damage to nail, initial encounter: Secondary | ICD-10-CM | POA: Diagnosis not present

## 2019-09-17 DIAGNOSIS — Z95828 Presence of other vascular implants and grafts: Secondary | ICD-10-CM | POA: Diagnosis not present

## 2019-09-17 DIAGNOSIS — S61031A Puncture wound without foreign body of right thumb without damage to nail, initial encounter: Secondary | ICD-10-CM

## 2019-09-17 DIAGNOSIS — M86141 Other acute osteomyelitis, right hand: Secondary | ICD-10-CM | POA: Diagnosis not present

## 2019-09-17 LAB — COMPREHENSIVE METABOLIC PANEL
ALT: 17 U/L (ref 0–44)
AST: 18 U/L (ref 15–41)
Albumin: 4.8 g/dL (ref 3.5–5.0)
Alkaline Phosphatase: 84 U/L (ref 38–126)
Anion gap: 11 (ref 5–15)
BUN: 9 mg/dL (ref 6–20)
CO2: 23 mmol/L (ref 22–32)
Calcium: 9.4 mg/dL (ref 8.9–10.3)
Chloride: 104 mmol/L (ref 98–111)
Creatinine, Ser: 0.88 mg/dL (ref 0.61–1.24)
GFR calc Af Amer: >60 mL/min (ref >60)
GFR calc non Af Amer: >60 mL/min (ref >60)
Glucose, Bld: 91 mg/dL (ref 70–99)
Potassium: 3.5 mmol/L (ref 3.5–5.1)
Sodium: 138 mmol/L (ref 135–145)
Total Bilirubin: 0.6 mg/dL (ref 0.3–1.2)
Total Protein: 7.9 g/dL (ref 6.5–8.1)

## 2019-09-17 LAB — CBC
HCT: 41.1 % (ref 39.0–52.0)
Hemoglobin: 13.3 g/dL (ref 13.0–17.0)
MCH: 26.8 pg (ref 26.0–34.0)
MCHC: 32.4 g/dL (ref 30.0–36.0)
MCV: 82.7 fL (ref 80.0–100.0)
Platelets: 269 10*3/uL (ref 150–400)
RBC: 4.97 MIL/uL (ref 4.22–5.81)
RDW: 14.1 % (ref 11.5–15.5)
WBC: 8.3 10*3/uL (ref 4.0–10.5)
nRBC: 0 % (ref 0.0–0.2)

## 2019-09-17 LAB — HIV ANTIBODY (ROUTINE TESTING W REFLEX): HIV Screen 4th Generation wRfx: NONREACTIVE

## 2019-09-17 LAB — SARS CORONAVIRUS 2 (TAT 6-24 HRS): SARS Coronavirus 2: NEGATIVE

## 2019-09-17 MED ORDER — SODIUM CHLORIDE 0.9% FLUSH: 10.0000 mL | INTRAVENOUS | Status: DC | PRN

## 2019-09-17 MED ORDER — SODIUM CHLORIDE 0.9 % IV SOLN
INTRAVENOUS | Status: DC | PRN
  Administered 2019-09-17: 250 mL via INTRAVENOUS

## 2019-09-17 MED ORDER — CHLORHEXIDINE GLUCONATE CLOTH 2 % EX PADS
6.0000 | MEDICATED_PAD | Freq: Every day | CUTANEOUS | Status: DC
  Administered 2019-09-18 – 2019-09-19 (×2): 6 via TOPICAL

## 2019-09-17 MED ORDER — SODIUM CHLORIDE 0.9% FLUSH: 10.0000 mL | Freq: Two times a day (BID) | INTRAVENOUS | Status: DC
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
## 2019-09-17 NOTE — Progress Notes (Signed)
Spoke with Amy RN re PICC order for d/c home. Notified will place this pm or 09/18/19 am.  Amy RN agreeable to plan

## 2019-09-17 NOTE — Progress Notes (Signed)
Peripherally Inserted Central Catheter/Midline Placement  The IV Nurse has discussed with the patient and/or persons authorized to consent for the patient, the purpose of this procedure and the potential benefits and risks involved with this procedure.  The benefits include less needle sticks, lab draws from the catheter, and the patient may be discharged home with the catheter. Risks include, but not limited to, infection, bleeding, blood clot (thrombus formation), and puncture of an artery; nerve damage and irregular heartbeat and possibility to perform a PICC exchange if needed/ordered by physician.  Alternatives to this procedure were also discussed.  Bard Power PICC patient education guide, fact sheet on infection prevention and patient information card has been provided to patient /or left at bedside.    PICC/Midline Placement Documentation  PICC Single Lumen 09/17/19 PICC Left Brachial 48 cm 1 cm (Active)  Indication for Insertion or Continuance of Line Home intravenous therapies (PICC only) 09/17/19 1700  Exposed Catheter (cm) 1 cm 09/17/19 1700  Site Assessment Clean;Dry;Intact 09/17/19 1700  Line Status Flushed;Blood return noted;Saline locked 09/17/19 1700  Dressing Type Transparent 09/17/19 1700  Dressing Status Clean;Dry;Intact;Antimicrobial disc in place 09/17/19 1700  Line Care Connections checked and tightened 09/17/19 1700  Line Adjustment (NICU/IV Team Only) No 09/17/19 1700  Dressing Intervention New dressing 09/17/19 1700  Dressing Change Due 09/24/19 09/17/19 1700       Elliot Dally 09/17/2019, 6:17 PM

## 2019-09-17 NOTE — Progress Notes (Signed)
PHARMACY CONSULT NOTE FOR:  OUTPATIENT  PARENTERAL ANTIBIOTIC THERAPY (OPAT)  Indication: Osteomyelitis Regimen: Ceftriaxone 2gm IV q24h and Vancomycin 1500mg  IV q12h End date: 10/27/2019  IV antibiotic discharge orders are pended. To discharging provider:  please sign these orders via discharge navigator,  Select New Orders & click on the button choice - Manage This Unsigned Work.     Thank you for allowing pharmacy to be a part of this patient's care.  10/29/2019 RPh 09/17/2019, 11:36 AM

## 2019-09-18 DIAGNOSIS — A4901 Methicillin susceptible Staphylococcus aureus infection, unspecified site: Secondary | ICD-10-CM

## 2019-09-18 DIAGNOSIS — S61339A Puncture wound without foreign body of unspecified finger with damage to nail, initial encounter: Secondary | ICD-10-CM

## 2019-09-18 DIAGNOSIS — B9561 Methicillin susceptible Staphylococcus aureus infection as the cause of diseases classified elsewhere: Secondary | ICD-10-CM

## 2019-09-18 DIAGNOSIS — Z95828 Presence of other vascular implants and grafts: Secondary | ICD-10-CM

## 2019-09-18 DIAGNOSIS — W3400XA Accidental discharge from unspecified firearms or gun, initial encounter: Secondary | ICD-10-CM

## 2019-09-18 MED ORDER — CEFTRIAXONE IV (FOR PTA / DISCHARGE USE ONLY): 2.0000 g | INTRAVENOUS | 0 refills | Status: DC

## 2019-09-18 MED ORDER — VANCOMYCIN IV (FOR PTA / DISCHARGE USE ONLY): 1500.0000 mg | Freq: Two times a day (BID) | INTRAVENOUS | 0 refills | Status: DC

## 2019-09-18 MED ORDER — RAMELTEON 8 MG PO TABS
8.0000 mg | ORAL_TABLET | Freq: Once | ORAL | Status: AC
  Administered 2019-09-18: 8 mg via ORAL
  Filled 2019-09-18: qty 1

## 2019-09-18 MED ORDER — CEFAZOLIN SODIUM-DEXTROSE 2-4 GM/100ML-% IV SOLN
2.0000 g | Freq: Three times a day (TID) | INTRAVENOUS | Status: DC
  Administered 2019-09-18 – 2019-09-19 (×4): 2 g via INTRAVENOUS
  Filled 2019-09-18 (×5): qty 100

## 2019-09-18 MED ORDER — CEFAZOLIN IV (FOR PTA / DISCHARGE USE ONLY): 2.0000 g | Freq: Three times a day (TID) | INTRAVENOUS | 0 refills | Status: DC
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
## 2019-09-18 NOTE — Progress Notes (Signed)
      INFECTIOUS DISEASE ATTENDING ADDENDUM:   Date: 09/18/2019  Patient name: Chad Davidson  Medical record number: [id number]  Date of birth: [birth year]   The patient is growing Staph aureus from his cultures from his finger.  I am changing his antibiotics to vancomycin and cefazolin here.  He is going home today but I do not think he needs ceftriaxone anymore.  For simplicity I would send him home with vancomycin if we still do not know the susceptibilities of the Staph aureus.  If he subsequently grows MSSA I will call the home health infusion company and change him from vancomycin to cefazolin   Paulette Blanch Dam 09/18/2019, 10:30 AM
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
## 2019-09-18 NOTE — Progress Notes (Signed)
PHARMACY CONSULT NOTE FOR:  OUTPATIENT  PARENTERAL ANTIBIOTIC THERAPY (OPAT)  Indication: Osteomyelitis Regimen: Ancef 2gm IV q8h and Vancomycin 1500mg  IV q12h End date: 10/27/2019  IV antibiotic discharge orders are pended. To discharging provider:  please sign these orders via discharge navigator,  Select New Orders & click on the button choice - Manage This Unsigned Work.     Thank you for allowing pharmacy to be a part of this patient's care.  10/29/2019, PharmD, BCPS 09/18/2019, 10:31 AM

## 2019-09-18 NOTE — TOC Progression Note (Signed)
Transition of Care [facility]) - Progression Note    Patient Details  Name: [name] MRN: [id number] Date of Birth: 23-Feb-2001  Transition of Care [facility]) CM/SW Contact  Coralyn Helling, [place] Phone Number: 09/18/2019, 2:40 PM  Clinical Narrative:   LCSW spoke with patien'ts mother. She was wanting clarification on why patient cannot go home today. LCSW explained need for IV antibiotics and RN assistance. LCSW notified patients mother that a RN would not be available until tomorrow and that patient would be dc sent home as soon as possible in the AM. She expressed understanding and stated she would explain to patient. She stated her number was the best contact for Home health. LCSW will notify Pam.     Expected Discharge Plan: Home w Home Health Services Barriers to Discharge: Other (comment)(waiting for [facility] nurse availability for IV abx)  Expected Discharge Plan and Services Expected Discharge Plan: Home w Home Health Services   Discharge Planning Services: CM Consult Post Acute Care Choice: Home Health Living arrangements for the past 2 months: Single Family Home Expected Discharge Date: 09/18/19               DME Arranged: N/A DME Agency: NA       HH Arranged: IV Antibiotics HH Agency: [facility] Date HH Agency Contacted: 09/18/19 Time HH Agency Contacted: 1149 Representative spoke with at [facility] Agency: pam chandler   Social Determinants of Health (SDOH) Interventions    Readmission Risk Interventions No flowsheet data found.

## 2019-09-19 DIAGNOSIS — M86141 Other acute osteomyelitis, right hand: Secondary | ICD-10-CM | POA: Diagnosis not present

## 2019-09-19 MED ORDER — HEPARIN SOD (PORK) LOCK FLUSH 100 UNIT/ML IV SOLN
250.0000 [IU] | INTRAVENOUS | Status: AC | PRN
  Administered 2019-09-19: 250 [IU]
  Filled 2019-09-19: qty 2.5

## 2019-09-19 NOTE — Progress Notes (Signed)
Patient seen and examined at the bedside this morning.  Hemodynamically stable.  Stable for discharge.  Discharge orders and summary already done yesterday.  No new changes.

## 2019-09-19 NOTE — Progress Notes (Signed)
      INFECTIOUS DISEASE ATTENDING ADDENDUM:   Date: 09/19/2019  Patient name: Chad Davidson  Medical record number: [id number]  Date of birth: 08/09/2001   Patient growing MSSA and can go home on ancef alone  We will sign off for now please call with questions.   Acey Lav 09/19/2019, 1:31 PM

## 2019-09-19 NOTE — Progress Notes (Signed)
D/C instructions given to patient. Patient had no questions. NT or writer will wheel patient out once family brings the car to the front

## 2019-09-21 LAB — AEROBIC/ANAEROBIC CULTURE W GRAM STAIN (SURGICAL/DEEP WOUND)
Gram Stain: NONE SEEN
Gram Stain: NONE SEEN

## 2019-09-22 DIAGNOSIS — M86141 Other acute osteomyelitis, right hand: Secondary | ICD-10-CM | POA: Diagnosis not present

## 2019-09-22 LAB — CULTURE, BLOOD (ROUTINE X 2)
Culture: NO GROWTH
Culture: NO GROWTH
Special Requests: ADEQUATE

## 2019-09-24 DIAGNOSIS — M86141 Other acute osteomyelitis, right hand: Secondary | ICD-10-CM | POA: Diagnosis not present

## 2019-09-26 DIAGNOSIS — M869 Osteomyelitis, unspecified: Secondary | ICD-10-CM | POA: Diagnosis not present

## 2019-09-30 DIAGNOSIS — M86141 Other acute osteomyelitis, right hand: Secondary | ICD-10-CM | POA: Diagnosis not present

## 2019-10-03 DIAGNOSIS — M869 Osteomyelitis, unspecified: Secondary | ICD-10-CM | POA: Diagnosis not present

## 2019-10-06 DIAGNOSIS — M86141 Other acute osteomyelitis, right hand: Secondary | ICD-10-CM | POA: Diagnosis not present

## 2019-10-10 DIAGNOSIS — M869 Osteomyelitis, unspecified: Secondary | ICD-10-CM | POA: Diagnosis not present

## 2019-10-13 DIAGNOSIS — S60351A Superficial foreign body of right thumb, initial encounter: Secondary | ICD-10-CM | POA: Diagnosis not present

## 2019-10-13 DIAGNOSIS — M86141 Other acute osteomyelitis, right hand: Secondary | ICD-10-CM | POA: Diagnosis not present

## 2019-10-18 ENCOUNTER — Telehealth: Payer: Self-pay

## 2019-10-18 ENCOUNTER — Ambulatory Visit: Payer: Medicaid Other | Admitting: Internal Medicine

## 2019-10-18 ENCOUNTER — Other Ambulatory Visit (HOSPITAL_COMMUNITY)
Admission: RE | Admit: 2019-10-18 | Discharge: 2019-10-18 | Disposition: A | Discharge status: Home / Self Care | Payer: Medicaid Other | Source: Other Acute Inpatient Hospital | Attending: Internal Medicine | Admitting: Internal Medicine

## 2019-10-18 ENCOUNTER — Encounter: Payer: Self-pay | Admitting: Internal Medicine

## 2019-10-18 ENCOUNTER — Ambulatory Visit (INDEPENDENT_AMBULATORY_CARE_PROVIDER_SITE_OTHER): Payer: Medicaid Other | Admitting: Internal Medicine

## 2019-10-18 ENCOUNTER — Other Ambulatory Visit: Payer: Self-pay

## 2019-10-18 VITALS — BP 115/67 | HR 76 | Wt 230.0 lb

## 2019-10-18 DIAGNOSIS — Z452 Encounter for adjustment and management of vascular access device: Secondary | ICD-10-CM

## 2019-10-18 DIAGNOSIS — M869 Osteomyelitis, unspecified: Secondary | ICD-10-CM

## 2019-10-18 DIAGNOSIS — A4901 Methicillin susceptible Staphylococcus aureus infection, unspecified site: Secondary | ICD-10-CM | POA: Diagnosis not present

## 2019-10-18 HISTORY — DX: Encounter for adjustment and management of vascular access device: Z45.2

## 2019-10-18 LAB — BASIC METABOLIC PANEL
Anion gap: 8 (ref 5–15)
BUN: 9 mg/dL (ref 6–20)
CO2: 26 mmol/L (ref 22–32)
Calcium: 9.4 mg/dL (ref 8.9–10.3)
Chloride: 105 mmol/L (ref 98–111)
Creatinine, Ser: 0.95 mg/dL (ref 0.61–1.24)
GFR calc Af Amer: >60 mL/min (ref >60)
GFR calc non Af Amer: >60 mL/min (ref >60)
Glucose, Bld: 97 mg/dL (ref 70–99)
Potassium: 3.9 mmol/L (ref 3.5–5.1)
Sodium: 139 mmol/L (ref 135–145)

## 2019-10-18 LAB — CBC WITH DIFFERENTIAL/PLATELET
Abs Immature Granulocytes: 0.01 10*3/uL (ref 0.00–0.07)
Basophils Absolute: 0 10*3/uL (ref 0.0–0.1)
Basophils Relative: 1 %
Eosinophils Absolute: 0.2 10*3/uL (ref 0.0–0.5)
Eosinophils Relative: 3 %
HCT: 37.8 % — ABNORMAL LOW (ref 39.0–52.0)
Hemoglobin: 12.2 g/dL — ABNORMAL LOW (ref 13.0–17.0)
Immature Granulocytes: 0 %
Lymphocytes Relative: 45 %
Lymphs Abs: 2.4 10*3/uL (ref 0.7–4.0)
MCH: 26.6 pg (ref 26.0–34.0)
MCHC: 32.3 g/dL (ref 30.0–36.0)
MCV: 82.5 fL (ref 80.0–100.0)
Monocytes Absolute: 0.6 10*3/uL (ref 0.1–1.0)
Monocytes Relative: 11 %
Neutro Abs: 2.1 10*3/uL (ref 1.7–7.7)
Neutrophils Relative %: 40 %
Platelets: 224 10*3/uL (ref 150–400)
RBC: 4.58 MIL/uL (ref 4.22–5.81)
RDW: 14.2 % (ref 11.5–15.5)
WBC: 5.3 10*3/uL (ref 4.0–10.5)
nRBC: 0 % (ref 0.0–0.2)

## 2019-10-18 LAB — C-REACTIVE PROTEIN: CRP: 1.2 mg/dL — ABNORMAL HIGH (ref <1.0)

## 2019-10-18 LAB — SEDIMENTATION RATE: Sed Rate: 8 mm/hr (ref 0–16)

## 2019-10-18 NOTE — Assessment & Plan Note (Signed)
picc is working well, no issues.  Will have home health remove this after treatment completion.

## 2019-10-18 NOTE — Assessment & Plan Note (Signed)
He is improving well and no issues.  Inflammatory markers are normalizing well.  Will plan to complete the antibiotics as expected on 2/25 and observe off of antibiotics.  He has follow up with the hand surgeon so will not follow up here unless indicated.

## 2019-10-18 NOTE — Assessment & Plan Note (Signed)
Infection is clearing as above and on ancef.  No changes
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
## 2019-10-18 NOTE — Telephone Encounter (Signed)
Per Dr. Luciana Axe, ok to pull PICC after completion of antibiotics.Orders confirmed.   Kazimir Hartnett Loyola Mast, RN

## 2019-10-20 DIAGNOSIS — M86141 Other acute osteomyelitis, right hand: Secondary | ICD-10-CM | POA: Diagnosis not present

## 2019-10-24 ENCOUNTER — Other Ambulatory Visit (HOSPITAL_COMMUNITY)
Admission: RE | Admit: 2019-10-24 | Discharge: 2019-10-24 | Disposition: A | Discharge status: Home / Self Care | Payer: Medicaid Other | Source: Other Acute Inpatient Hospital | Attending: Internal Medicine | Admitting: Internal Medicine

## 2019-10-24 DIAGNOSIS — M869 Osteomyelitis, unspecified: Secondary | ICD-10-CM | POA: Insufficient documentation

## 2019-10-24 LAB — BASIC METABOLIC PANEL
Anion gap: 10 (ref 5–15)
BUN: 8 mg/dL (ref 6–20)
CO2: 27 mmol/L (ref 22–32)
Calcium: 9.5 mg/dL (ref 8.9–10.3)
Chloride: 104 mmol/L (ref 98–111)
Creatinine, Ser: 0.9 mg/dL (ref 0.61–1.24)
GFR calc Af Amer: >60 mL/min (ref >60)
GFR calc non Af Amer: >60 mL/min (ref >60)
Glucose, Bld: 91 mg/dL (ref 70–99)
Potassium: 4 mmol/L (ref 3.5–5.1)
Sodium: 141 mmol/L (ref 135–145)

## 2019-10-24 LAB — CBC WITH DIFFERENTIAL/PLATELET
Abs Immature Granulocytes: 0.01 10*3/uL (ref 0.00–0.07)
Basophils Absolute: 0 10*3/uL (ref 0.0–0.1)
Basophils Relative: 1 %
Eosinophils Absolute: 0.2 10*3/uL (ref 0.0–0.5)
Eosinophils Relative: 3 %
HCT: 38.6 % — ABNORMAL LOW (ref 39.0–52.0)
Hemoglobin: 12.4 g/dL — ABNORMAL LOW (ref 13.0–17.0)
Immature Granulocytes: 0 %
Lymphocytes Relative: 48 %
Lymphs Abs: 2.5 10*3/uL (ref 0.7–4.0)
MCH: 26.5 pg (ref 26.0–34.0)
MCHC: 32.1 g/dL (ref 30.0–36.0)
MCV: 82.5 fL (ref 80.0–100.0)
Monocytes Absolute: 0.5 10*3/uL (ref 0.1–1.0)
Monocytes Relative: 9 %
Neutro Abs: 2.1 10*3/uL (ref 1.7–7.7)
Neutrophils Relative %: 39 %
Platelets: 229 10*3/uL (ref 150–400)
RBC: 4.68 MIL/uL (ref 4.22–5.81)
RDW: 14.6 % (ref 11.5–15.5)
WBC: 5.2 10*3/uL (ref 4.0–10.5)
nRBC: 0 % (ref 0.0–0.2)

## 2019-10-24 LAB — SEDIMENTATION RATE: Sed Rate: 3 mm/hr (ref 0–16)

## 2019-10-24 LAB — C-REACTIVE PROTEIN: CRP: 1 mg/dL — ABNORMAL HIGH (ref <1.0)

## 2020-03-20 NOTE — Patient Instructions (Addendum)
Thank you for coming to see me today. It was a pleasure.   I encourage you to get your medical records from [facility] and have them sent to our clinic.  Please do low back exercises each 10 times daily. Flexeril at night only.  Do not operate any machinery with this medication as it can cause drowsiness.    Please follow-up with PCP in 4 weeks  If you have any questions or concerns, please do not hesitate to call the office at [phone].  Best,   Dana Allan, MD Family Medicine Residency    Outpatient Mental Health Providers (No Insurance required or Self Pay)  [facility]) can see uninsured folks for outpatient therapy [email or web address] [address] [place], [place] [address] [phone]  [facility]    Walk-in Mon-Fri, 8am-3pm [email or web address] Scull [address] [place], [place]  [phone]   [address]  [place] [phone] [facility] for psych med management, there may be a wait- if [facility] is working with clients for OPT, they will coordinate with [facility] for psych  [facility]   Walk-in-Clinic: Monday- Friday 9:00 AM - 4:00 PM [address]   [place], [place] [phone]  [facility] [place] ([employer]) walk in M-F 8am-12pm and  1pm-3pm [place]- [address]     [phone]  [employer] -[address]  Phone: [phone]  [employer] (Mental Health and substance challenges) [address]   [place] [phone]    [email or web address]    [facility]  [facility] [place], [place]     Phone:  [phone] [place]-  [address]  [phone]   [facility]  [address] [place]  [phone] [email or web address]   [facility] ( virtual or zoom therapy) [email or web address]  [address] [place]  [phone]    [facility] [phone]  grief counseling, dementia and caregiver support    [facility] Walk-in MWF 12:30 to 3:00     [address] [place] [address]  [phone]  [email or web address].[email or web address] call to schedule an appointment    [facility]ental [facility] Classes ,Support group, Peer support services, [address], [place], [place] [address] [phone]  PhotoSolver.pl           National Alliance on Mental Illness (NAMI) [place]- Wellness classes, Support groups        [address], [place], [place] [address] [phone]   [email or web address]   [facility]  (Psycho-social Rehabilitation clubhouse, Individual and group therapy) [address] [place], [place] [address]   [phone]  24- Hour Availability:  [facility] [phone] or [phone] * [facility] [employer] (Domestic Violence, Rape, etc. )[phone] [facility] [employer] [phone] or [phone] * [facility] [phone] only) [phone] (after hours) *[facility] Mobile Crisis Unit [phone] *[place] National Suicide Hotline 8200598889 Len Childs)
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
## 2020-03-21 ENCOUNTER — Other Ambulatory Visit: Payer: Self-pay

## 2020-03-21 ENCOUNTER — Ambulatory Visit (INDEPENDENT_AMBULATORY_CARE_PROVIDER_SITE_OTHER): Payer: Medicaid Other | Admitting: Family Medicine

## 2020-03-21 ENCOUNTER — Encounter: Payer: Self-pay | Admitting: Family Medicine

## 2020-03-21 ENCOUNTER — Ambulatory Visit (HOSPITAL_COMMUNITY)
Admission: RE | Admit: 2020-03-21 | Discharge: 2020-03-21 | Disposition: A | Discharge status: Home / Self Care | Payer: Medicaid Other | Source: Ambulatory Visit | Attending: Family Medicine | Admitting: Family Medicine

## 2020-03-21 VITALS — BP 115/80 | HR 86 | Ht 71.0 in | Wt 223.8 lb

## 2020-03-21 DIAGNOSIS — Z79899 Other long term (current) drug therapy: Secondary | ICD-10-CM | POA: Diagnosis not present

## 2020-03-21 DIAGNOSIS — G8929 Other chronic pain: Secondary | ICD-10-CM

## 2020-03-21 DIAGNOSIS — M549 Dorsalgia, unspecified: Secondary | ICD-10-CM

## 2020-03-21 DIAGNOSIS — M545 Low back pain: Secondary | ICD-10-CM

## 2020-03-21 DIAGNOSIS — F909 Attention-deficit hyperactivity disorder, unspecified type: Secondary | ICD-10-CM | POA: Diagnosis not present

## 2020-03-21 DIAGNOSIS — Z8659 Personal history of other mental and behavioral disorders: Secondary | ICD-10-CM | POA: Diagnosis not present

## 2020-03-21 DIAGNOSIS — Z7689 Persons encountering health services in other specified circumstances: Secondary | ICD-10-CM | POA: Diagnosis not present

## 2020-03-21 MED ORDER — CYCLOBENZAPRINE HCL 10 MG PO TABS: 5.0000 mg | ORAL_TABLET | Freq: Every day | ORAL | 0 refills | Status: DC

## 2020-03-21 MED ORDER — IBUPROFEN 200 MG PO TABS
400.0000 mg | ORAL_TABLET | Freq: Four times a day (QID) | ORAL | 0 refills | Status: AC | PRN
Start: 2020-03-21 — End: 2020-04-20

## 2020-03-21 MED ORDER — ACETAMINOPHEN ER 650 MG PO TBCR
1300.0000 mg | EXTENDED_RELEASE_TABLET | Freq: Two times a day (BID) | ORAL | 0 refills | Status: AC
Start: 2020-03-21 — End: 2020-04-20

## 2020-03-25 ENCOUNTER — Encounter: Payer: Self-pay | Admitting: Family Medicine

## 2020-03-25 DIAGNOSIS — Z8659 Personal history of other mental and behavioral disorders: Secondary | ICD-10-CM | POA: Insufficient documentation

## 2020-03-25 DIAGNOSIS — M549 Dorsalgia, unspecified: Secondary | ICD-10-CM | POA: Insufficient documentation

## 2020-03-25 DIAGNOSIS — Z7689 Persons encountering health services in other specified circumstances: Secondary | ICD-10-CM | POA: Insufficient documentation

## 2020-03-25 HISTORY — DX: Persons encountering health services in other specified circumstances: Z76.89
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
## 2020-03-25 NOTE — Assessment & Plan Note (Signed)
Resources given for mental health counseling and providers. Would like patient evaluated for ADHD as unable to find anything in chart other than medications given. -request patient obtain previous medical records from [facility] sent to clinic  -follow 4 weeks or sooner if needed

## 2020-09-07 ENCOUNTER — Other Ambulatory Visit: Payer: Medicaid Other
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
## 2021-03-05 ENCOUNTER — Ambulatory Visit: Payer: Medicaid Other | Admitting: Family Medicine

## 2021-03-05 NOTE — Patient Instructions (Addendum)
Thank you for coming to see me today. It was a pleasure.   I will send a referral to Ears,nose and throat doctor for further evaluation for the ringing of your ears  I will send a referral to GI doctor for evaluation of blood in your stool  Prescription for Lidoderm patch daily Heat and Ice to back as needed Referral sent to Physical Therapy  Please follow-up with PCP if no improvement in symptoms  If you have any questions or concerns, please do not hesitate to call the office at [phone].  Best,   Dana Allan, MD    Back Exercises These exercises help to make your trunk and back strong. They also help to keep the lower back flexible. Doing these exercises can help to prevent back pain or lessen existing pain. If you have back pain, try to do these exercises 2-3 times each day or as told by your doctor. As you get better, do the exercises once each day. Repeat the exercises more often as told by your doctor. To stop back pain from coming back, do the exercises once each day, or as told by your doctor. Exercises Single knee to chest Do these steps 3-5 times in a row for each leg: Lie on your back on a firm bed or the floor with your legs stretched out. Bring one knee to your chest. Grab your knee or thigh with both hands and hold them it in place. Pull on your knee until you feel a gentle stretch in your lower back or buttocks. Keep doing the stretch for 10-30 seconds. Slowly let go of your leg and straighten it. Pelvic tilt Do these steps 5-10 times in a row: Lie on your back on a firm bed or the floor with your legs stretched out. Bend your knees so they point up to the ceiling. Your feet should be flat on the floor. Tighten your lower belly (abdomen) muscles to press your lower back against the floor. This will make your tailbone point up to the ceiling instead of pointing down to your feet or the floor. Stay in this position for 5-10 seconds while you gently tighten  your muscles and breathe evenly. Cat-cow Do these steps until your lower back bends more easily: Get on your hands and knees on a firm surface. Keep your hands under your shoulders, and keep your knees under your hips. You may put padding under your knees. Let your head hang down toward your chest. Tighten (contract) the muscles in your belly. Point your tailbone toward the floor so your lower back becomes rounded like the back of a cat. Stay in this position for 5 seconds. Slowly lift your head. Let the muscles of your belly relax. Point your tailbone up toward the ceiling so your back forms a sagging arch like the back of a cow. Stay in this position for 5 seconds.  Press-ups Do these steps 5-10 times in a row: Lie on your belly (face-down) on the floor. Place your hands near your head, about shoulder-width apart. While you keep your back relaxed and keep your hips on the floor, slowly straighten your arms to raise the top half of your body and lift your shoulders. Do not use your back muscles. You may change where you place your hands in order to make yourself more comfortable. Stay in this position for 5 seconds. Slowly return to lying flat on the floor.  Bridges Do these steps 10 times in a row: Lie  on your back on a firm surface. Bend your knees so they point up to the ceiling. Your feet should be flat on the floor. Your arms should be flat at your sides, next to your body. Tighten your butt muscles and lift your butt off the floor until your waist is almost as high as your knees. If you do not feel the muscles working in your butt and the back of your thighs, slide your feet 1-2 inches farther away from your butt. Stay in this position for 3-5 seconds. Slowly lower your butt to the floor, and let your butt muscles relax. If this exercise is too easy, try doing it with your arms crossed over yourchest. Belly crunches Do these steps 5-10 times in a row: Lie on your back on a firm bed  or the floor with your legs stretched out. Bend your knees so they point up to the ceiling. Your feet should be flat on the floor. Cross your arms over your chest. Tip your chin a little bit toward your chest but do not bend your neck. Tighten your belly muscles and slowly raise your chest just enough to lift your shoulder blades a tiny bit off of the floor. Avoid raising your body higher than that, because it can put too much stress on your low back. Slowly lower your chest and your head to the floor. Back lifts Do these steps 5-10 times in a row: Lie on your belly (face-down) with your arms at your sides, and rest your forehead on the floor. Tighten the muscles in your legs and your butt. Slowly lift your chest off of the floor while you keep your hips on the floor. Keep the back of your head in line with the curve in your back. Look at the floor while you do this. Stay in this position for 3-5 seconds. Slowly lower your chest and your face to the floor. Contact a doctor if: Your back pain gets a lot worse when you do an exercise. Your back pain does not get better 2 hours after you exercise. If you have any of these problems, stop doing the exercises. Do not do them again unless your doctor says it is okay. Get help right away if: You have sudden, very bad back pain. If this happens, stop doing the exercises. Do not do them again unless your doctor says it is okay. This information is not intended to replace advice given to you by your health care provider. Make sure you discuss any questions you have with your healthcare provider. Document Revised: 05/13/2018 Document Reviewed: 05/13/2018 Elsevier Patient Education  2022 [employer].
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
## 2021-03-06 ENCOUNTER — Ambulatory Visit (INDEPENDENT_AMBULATORY_CARE_PROVIDER_SITE_OTHER): Payer: Medicaid Other | Admitting: Family Medicine

## 2021-03-06 ENCOUNTER — Other Ambulatory Visit: Payer: Self-pay

## 2021-03-06 VITALS — BP 125/80 | HR 89 | Ht 71.0 in | Wt 232.6 lb

## 2021-03-06 DIAGNOSIS — M549 Dorsalgia, unspecified: Secondary | ICD-10-CM

## 2021-03-06 DIAGNOSIS — G8929 Other chronic pain: Secondary | ICD-10-CM

## 2021-03-06 DIAGNOSIS — H9312 Tinnitus, left ear: Secondary | ICD-10-CM

## 2021-03-06 DIAGNOSIS — K921 Melena: Secondary | ICD-10-CM

## 2021-03-06 MED ORDER — LIDOCAINE 5 % EX PTCH: 1.0000 | MEDICATED_PATCH | CUTANEOUS | 0 refills | Status: AC

## 2021-03-11 ENCOUNTER — Encounter: Payer: Self-pay | Admitting: Family Medicine

## 2021-03-11 DIAGNOSIS — K921 Melena: Secondary | ICD-10-CM | POA: Insufficient documentation

## 2021-03-11 DIAGNOSIS — H9312 Tinnitus, left ear: Secondary | ICD-10-CM | POA: Insufficient documentation

## 2021-03-11 NOTE — Assessment & Plan Note (Signed)
2 episodes of reported bright red blood in stool last week. No further episodes.  Asymptomatic. Likely low fiber intake but given not clear on history will refer. -Increase fiber diet -Continue to drink plenty of water -Refer GI for evaluation -Follow up as needed
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
## 2021-03-13 ENCOUNTER — Encounter: Payer: Self-pay | Admitting: Family Medicine

## 2021-03-15 ENCOUNTER — Telehealth: Payer: Self-pay

## 2021-03-15 NOTE — Telephone Encounter (Signed)
Medication approved through 03/15/2022. The pharmacy has been updated.

## 2021-03-15 NOTE — Telephone Encounter (Signed)
Received fax from pharmacy, PA needed on Lidocaine Patches 5%. Clinical questions submitted via Cover My Meds. Waiting on response, could take up to 72 hours.  Cover My Meds info: Key: [id number]

## 2021-03-20 ENCOUNTER — Ambulatory Visit: Payer: Medicaid Other | Attending: Family Medicine

## 2021-06-12 DIAGNOSIS — T148XXD Other injury of unspecified body region, subsequent encounter: Secondary | ICD-10-CM | POA: Diagnosis not present

## 2021-06-12 DIAGNOSIS — Z23 Encounter for immunization: Secondary | ICD-10-CM | POA: Diagnosis not present

## 2021-06-12 DIAGNOSIS — S61202A Unspecified open wound of right middle finger without damage to nail, initial encounter: Secondary | ICD-10-CM | POA: Diagnosis not present

## 2021-08-15 DIAGNOSIS — M25519 Pain in unspecified shoulder: Secondary | ICD-10-CM | POA: Diagnosis not present

## 2021-08-15 DIAGNOSIS — Z6831 Body mass index (BMI) 31.0-31.9, adult: Secondary | ICD-10-CM | POA: Diagnosis not present

## 2021-08-15 DIAGNOSIS — M940 Chondrocostal junction syndrome [Tietze]: Secondary | ICD-10-CM | POA: Diagnosis not present

## 2021-09-23 ENCOUNTER — Ambulatory Visit: Payer: Medicaid Other | Admitting: Family Medicine

## 2021-09-24 ENCOUNTER — Other Ambulatory Visit: Payer: Self-pay

## 2021-09-24 ENCOUNTER — Ambulatory Visit (INDEPENDENT_AMBULATORY_CARE_PROVIDER_SITE_OTHER): Payer: Medicaid Other | Admitting: Family Medicine

## 2021-09-24 VITALS — BP 128/75 | HR 79 | Ht 71.0 in | Wt 217.6 lb

## 2021-09-24 DIAGNOSIS — M25511 Pain in right shoulder: Secondary | ICD-10-CM | POA: Diagnosis not present

## 2021-09-24 DIAGNOSIS — M79601 Pain in right arm: Secondary | ICD-10-CM

## 2021-09-24 DIAGNOSIS — M79602 Pain in left arm: Secondary | ICD-10-CM | POA: Diagnosis not present

## 2021-09-24 DIAGNOSIS — R29898 Other symptoms and signs involving the musculoskeletal system: Secondary | ICD-10-CM | POA: Diagnosis not present

## 2021-09-24 DIAGNOSIS — M25512 Pain in left shoulder: Secondary | ICD-10-CM | POA: Diagnosis not present

## 2021-09-24 DIAGNOSIS — M79642 Pain in left hand: Secondary | ICD-10-CM

## 2021-09-24 DIAGNOSIS — M79641 Pain in right hand: Secondary | ICD-10-CM

## 2021-09-24 DIAGNOSIS — G8929 Other chronic pain: Secondary | ICD-10-CM

## 2021-09-24 DIAGNOSIS — M25519 Pain in unspecified shoulder: Secondary | ICD-10-CM | POA: Insufficient documentation

## 2021-09-24 MED ORDER — MELOXICAM 15 MG PO TABS: 15.0000 mg | ORAL_TABLET | Freq: Every day | ORAL | 0 refills | Status: DC

## 2021-09-24 NOTE — Patient Instructions (Signed)
It was wonderful to see you today. Thank you for allowing me to be a part of your care. Below is a short summary of what we discussed at your visit today:  Shoulder and arm pain - Take meloxicam once a day for 2 to 3-week trial and see if it helps - Continue lidocaine patches if it helps - Continue your daily stretching - I referred you to both physical therapy for your shoulders and arms - I have referred you to occupational therapy for your hands and grip strength weakness - Follow-up with PCP in 1 month (I have booked you an appointment to get you on the schedule, if it does not work for you please call a friend [place] directly and reschedule.)  Cooking and Nutrition Classes [employer] in [place] provides many classes at low or no cost to [employer], nutrition, and agriculture.  Their website offers a huge variety of information related to topics such as gardening, nutrition, cooking, parenting, and health.  Also listed are classes and events, both online and in-person.  Check out their website here: [email or web address]    Please bring all of your medications to every appointment!  If you have any questions or concerns, please do not hesitate to contact us via phone or MyChart message.   Ezequiel Essex, MD

## 2021-09-24 NOTE — Assessment & Plan Note (Signed)
Chronic, duration couple years.  Patient reports intermittent pain usually morning that persists despite switching to a job where he uses his hands less.  Maternal grandmother had rheumatoid arthritis.  Previous inflammatory markers negative.  For now, will do trial of meloxicam to see if this helps and refer to OT.  No imaging at this time, however could consider hand x-rays to assess for early RA if continues.
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
## 2021-10-21 ENCOUNTER — Other Ambulatory Visit: Payer: Self-pay

## 2021-10-21 ENCOUNTER — Encounter: Payer: Self-pay | Admitting: Family Medicine

## 2021-10-21 ENCOUNTER — Other Ambulatory Visit: Payer: Self-pay | Admitting: Family Medicine

## 2021-10-21 ENCOUNTER — Ambulatory Visit (INDEPENDENT_AMBULATORY_CARE_PROVIDER_SITE_OTHER): Payer: Medicaid Other | Admitting: Family Medicine

## 2021-10-21 VITALS — BP 122/79 | HR 76 | Wt 216.8 lb

## 2021-10-21 DIAGNOSIS — M79641 Pain in right hand: Secondary | ICD-10-CM

## 2021-10-21 DIAGNOSIS — R202 Paresthesia of skin: Secondary | ICD-10-CM

## 2021-10-21 DIAGNOSIS — M79642 Pain in left hand: Secondary | ICD-10-CM | POA: Diagnosis not present

## 2021-10-21 DIAGNOSIS — G8929 Other chronic pain: Secondary | ICD-10-CM

## 2021-10-21 DIAGNOSIS — M79601 Pain in right arm: Secondary | ICD-10-CM

## 2021-10-21 DIAGNOSIS — M25511 Pain in right shoulder: Secondary | ICD-10-CM

## 2021-10-21 MED ORDER — GABAPENTIN 100 MG PO CAPS: 100.0000 mg | ORAL_CAPSULE | Freq: Every day | ORAL | 0 refills | Status: DC

## 2021-10-21 NOTE — Progress Notes (Signed)
° ° °  SUBJECTIVE:   CHIEF COMPLAINT / HPI: follow up hand pain  Presents for follow up for bilateral hand pain. Seen in clinic on 01/24 and treated with Meloxicam.  Since then patient reports no improvement in symptoms.  Continues to have pain in hands that is worse on waking.  Intermittent tingling up to elbow.  Reports difficulty making a fist and feels like strength decreased in hands.  Has switched jobs so Architectural technologist work. Still doing a lot of repetitive movements.  Dominant hand seems a little worse.  Denies any fevers. No pain today  PERTINENT  PMH / PSH:  None  OBJECTIVE:   BP 122/79    Pulse 76    Wt 216 lb 12.8 oz (98.3 kg)    SpO2 100%    BMI 30.24 kg/m    General: Alert, no acute distress Hands: Inspection: No obvious deformity. No swelling, erythema or bruising Palpation: no TTP ROM: Full ROM of the digits and wrist. Fully able to extend and flex finger. Strength: 5/5 strength in the forearm, wrist and interosseus muscles Neurovascular: NV intact Special tests: Negative finkelstein's, negative tinel's at the carpal tunnel, negative Phalen's and reverse Phalen's  Fingers:  No swelling in PIP, DIP joints. Flexor digitorum profundus and superficialis tendon functions are intact.  PIP joint collateral ligaments are stable     ASSESSMENT/PLAN:   Bilateral hand pain Benign exam today. Unknown etiology.   -CBC, CRP, ESR, TSH, Vit B12, RPR, RF, CMP, Anti CCP to r/o underlying causes -bilateral hand xray -Gabapentin 100 mg qhs -Follow up with results -Follow in 4 weeks or sooner if no improvement     Carollee Leitz, MD [place]

## 2021-10-21 NOTE — Patient Instructions (Addendum)
Thank you for coming to see me today. It was a pleasure.   We will get some labs today.  If they are abnormal or we need to do something about them, I will call you.  If they are normal, I will send you a message on MyChart (if it is active) or a letter in the mail.  If you don't hear from [place] in 2 weeks, please call the office at the number below.   I have placed an order for both your hands.  Please go to [facility] at [employer] or at [facility] to have this completed.  You do not need an appointment, but if you would like to call them beforehand, their number is [phone].  We will contact you with your results afterwards.   If you would like some assistance to help quit smoking please schedule an appointment with me to discuss the many ways that we can assist you if and when you decide you are ready to take the next step toward a smoke free lifestyle.  Please follow-up with PCP as needed  If you have any questions or concerns, please do not hesitate to call the office at [phone].  Best,   Dana Allan, MD    Hand Exercises Hand exercises can be helpful for almost anyone. These exercises can strengthen the hands, improve flexibility and movement, and increase blood flow to the hands. These results can make work and daily tasks easier. Hand exercises can be especially helpful for people who have joint pain from arthritis or have nerve damage from overuse (carpal tunnel syndrome). These exercises can also help people who have injured a hand. Exercises Most of these hand exercises are gentle stretching and motion exercises. It is usually safe to do them often throughout the day. Warming up your hands before exercise may help to reduce stiffness. You can do this with gentle massage or by placing your hands in warm water for 10-15 minutes. It is normal to feel some stretching, pulling, tightness, or mild discomfort as you begin new exercises. This will gradually  improve. Stop an exercise right away if you feel sudden, severe pain or your pain gets worse. Ask your health care provider which exercises are best for you. Knuckle bend or "claw" fist  Stand or sit with your arm, hand, and all five fingers pointed straight up. Make sure to keep your wrist straight during the exercise. Gently bend your fingers down toward your palm until the tips of your fingers are touching the top of your palm. Keep your big knuckle straight and just bend the small knuckles in your fingers. Hold this position for __________ seconds. Straighten (extend) your fingers back to the starting position. Repeat this exercise 5-10 times with each hand. Full finger fist  Stand or sit with your arm, hand, and all five fingers pointed straight up. Make sure to keep your wrist straight during the exercise. Gently bend your fingers into your palm until the tips of your fingers are touching the middle of your palm. Hold this position for __________ seconds. Extend your fingers back to the starting position, stretching every joint fully. Repeat this exercise 5-10 times with each hand. Straight fist Stand or sit with your arm, hand, and all five fingers pointed straight up. Make sure to keep your wrist straight during the exercise. Gently bend your fingers at the big knuckle, where your fingers meet your hand, and the middle knuckle. Keep the knuckle at  the tips of your fingers straight and try to touch the bottom of your palm. Hold this position for __________ seconds. Extend your fingers back to the starting position, stretching every joint fully. Repeat this exercise 5-10 times with each hand. Tabletop  Stand or sit with your arm, hand, and all five fingers pointed straight up. Make sure to keep your wrist straight during the exercise. Gently bend your fingers at the big knuckle, where your fingers meet your hand, as far down as you can while keeping the small knuckles in your fingers  straight. Think of forming a tabletop with your fingers. Hold this position for __________ seconds. Extend your fingers back to the starting position, stretching every joint fully. Repeat this exercise 5-10 times with each hand. Finger spread  Place your hand flat on a table with your palm facing down. Make sure your wrist stays straight as you do this exercise. Spread your fingers and thumb apart from each other as far as you can until you feel a gentle stretch. Hold this position for __________ seconds. Bring your fingers and thumb tight together again. Hold this position for __________ seconds. Repeat this exercise 5-10 times with each hand. Making circles  Stand or sit with your arm, hand, and all five fingers pointed straight up. Make sure to keep your wrist straight during the exercise. Make a circle by touching the tip of your thumb to the tip of your index finger. Hold for __________ seconds. Then open your hand wide. Repeat this motion with your thumb and each finger on your hand. Repeat this exercise 5-10 times with each hand. Thumb motion  Sit with your forearm resting on a table and your wrist straight. Your thumb should be facing up toward the ceiling. Keep your fingers relaxed as you move your thumb. Lift your thumb up as high as you can toward the ceiling. Hold for __________ seconds. Bend your thumb across your palm as far as you can, reaching the tip of your thumb for the small finger (pinkie) side of your palm. Hold for __________ seconds. Repeat this exercise 5-10 times with each hand. Grip strengthening  Hold a stress ball or other soft ball in the middle of your hand. Slowly increase the pressure, squeezing the ball as much as you can without causing pain. Think of bringing the tips of your fingers into the middle of your palm. All of your finger joints should bend when doing this exercise. Hold your squeeze for __________ seconds, then relax. Repeat this exercise  5-10 times with each hand. Contact a health care provider if: Your hand pain or discomfort gets much worse when you do an exercise. Your hand pain or discomfort does not improve within 2 hours after you exercise. If you have any of these problems, stop doing these exercises right away. Do not do them again unless your health care provider says that you can. Get help right away if: You develop sudden, severe hand pain or swelling. If this happens, stop doing these exercises right away. Do not do them again unless your health care provider says that you can. This information is not intended to replace advice given to you by your health care provider. Make sure you discuss any questions you have with your health care provider. Document Revised: 12/06/2020 Document Reviewed: 12/06/2020 Elsevier Patient Education  [place].

## 2021-10-24 ENCOUNTER — Encounter: Payer: Self-pay | Admitting: Family Medicine

## 2021-10-24 LAB — COMPREHENSIVE METABOLIC PANEL
ALT: 16 IU/L (ref 0–44)
AST: 17 IU/L (ref 0–40)
Albumin/Globulin Ratio: 2.1 (ref 1.2–2.2)
Albumin: 4.9 g/dL (ref 4.1–5.2)
Alkaline Phosphatase: 84 IU/L (ref 44–121)
BUN/Creatinine Ratio: 7 — ABNORMAL LOW (ref 9–20)
BUN: 6 mg/dL (ref 6–20)
Bilirubin Total: <0.2 mg/dL (ref 0.0–1.2)
CO2: 23 mmol/L (ref 20–29)
Calcium: 9.9 mg/dL (ref 8.7–10.2)
Chloride: 103 mmol/L (ref 96–106)
Creatinine, Ser: 0.86 mg/dL (ref 0.76–1.27)
Globulin, Total: 2.3 g/dL (ref 1.5–4.5)
Glucose: 95 mg/dL (ref 70–99)
Potassium: 4.1 mmol/L (ref 3.5–5.2)
Sodium: 140 mmol/L (ref 134–144)
Total Protein: 7.2 g/dL (ref 6.0–8.5)
eGFR: 126 mL/min/{1.73_m2} (ref >59)

## 2021-10-24 LAB — CBC WITH DIFFERENTIAL/PLATELET
Basophils Absolute: 0 10*3/uL (ref 0.0–0.2)
Basos: 1 %
EOS (ABSOLUTE): 0.2 10*3/uL (ref 0.0–0.4)
Eos: 3 %
Hematocrit: 37.8 % (ref 37.5–51.0)
Hemoglobin: 12.6 g/dL — ABNORMAL LOW (ref 13.0–17.7)
Immature Grans (Abs): 0 10*3/uL (ref 0.0–0.1)
Immature Granulocytes: 0 %
Lymphocytes Absolute: 2.4 10*3/uL (ref 0.7–3.1)
Lymphs: 47 %
MCH: 26.9 pg (ref 26.6–33.0)
MCHC: 33.3 g/dL (ref 31.5–35.7)
MCV: 81 fL (ref 79–97)
Monocytes Absolute: 0.4 10*3/uL (ref 0.1–0.9)
Monocytes: 8 %
Neutrophils Absolute: 2.1 10*3/uL (ref 1.4–7.0)
Neutrophils: 41 %
Platelets: 287 10*3/uL (ref 150–450)
RBC: 4.69 x10E6/uL (ref 4.14–5.80)
RDW: 14 % (ref 11.6–15.4)
WBC: 5.2 10*3/uL (ref 3.4–10.8)

## 2021-10-24 LAB — RHEUMATOID FACTOR: Rheumatoid fact SerPl-aCnc: <10 IU/mL (ref <14.0)

## 2021-10-24 LAB — SEDIMENTATION RATE: Sed Rate: 8 mm/hr (ref 0–15)

## 2021-10-24 LAB — VITAMIN B12: Vitamin B-12: 647 pg/mL (ref 232–1245)

## 2021-10-24 LAB — HIV ANTIBODY (ROUTINE TESTING W REFLEX): HIV Screen 4th Generation wRfx: NONREACTIVE

## 2021-10-24 LAB — TSH: TSH: 1.72 u[IU]/mL (ref 0.450–4.500)

## 2021-10-24 LAB — ANTI-CCP AB, IGG + IGA (RDL): Anti-CCP Ab, IgG + IgA (RDL): <20 Units (ref <20)

## 2021-10-24 LAB — T PALLIDUM ANTIBODY, EIA: T pallidum Antibody, EIA: NEGATIVE

## 2021-10-24 LAB — C-REACTIVE PROTEIN: CRP: 2 mg/L (ref 0–10)

## 2021-10-24 LAB — RPR W/REFLEX TO TREPSURE: RPR: NONREACTIVE

## 2021-10-24 NOTE — Assessment & Plan Note (Addendum)
Benign exam today. Unknown etiology.   -CBC, CRP, ESR, TSH, Vit B12, RPR, RF, CMP, Anti CCP to r/o underlying causes -bilateral hand xray -Gabapentin 100 mg qhs -Follow up with results -Follow in 4 weeks or sooner if no improvement

## 2021-10-27 ENCOUNTER — Encounter: Payer: Self-pay | Admitting: Family Medicine

## 2021-11-18 ENCOUNTER — Other Ambulatory Visit: Payer: Self-pay | Admitting: Family Medicine

## 2021-11-18 ENCOUNTER — Ambulatory Visit: Payer: Medicaid Other | Admitting: Family Medicine

## 2021-11-18 DIAGNOSIS — G8929 Other chronic pain: Secondary | ICD-10-CM

## 2021-11-18 DIAGNOSIS — M79602 Pain in left arm: Secondary | ICD-10-CM

## 2021-11-21 DIAGNOSIS — Z03818 Encounter for observation for suspected exposure to other biological agents ruled out: Secondary | ICD-10-CM | POA: Diagnosis not present

## 2021-11-21 DIAGNOSIS — Z20822 Contact with and (suspected) exposure to covid-19: Secondary | ICD-10-CM | POA: Diagnosis not present

## 2022-02-04 ENCOUNTER — Encounter: Payer: Self-pay | Admitting: *Deleted

## 2022-03-03 ENCOUNTER — Other Ambulatory Visit: Payer: Self-pay

## 2022-03-03 ENCOUNTER — Emergency Department (HOSPITAL_BASED_OUTPATIENT_CLINIC_OR_DEPARTMENT_OTHER)
Admission: EM | Admit: 2022-03-03 | Discharge: 2022-03-04 | Disposition: A | Discharge status: Home / Self Care | Payer: Self-pay | Attending: Emergency Medicine | Admitting: Emergency Medicine

## 2022-03-03 ENCOUNTER — Emergency Department (HOSPITAL_BASED_OUTPATIENT_CLINIC_OR_DEPARTMENT_OTHER): Payer: Medicaid Other | Admitting: Radiology

## 2022-03-03 DIAGNOSIS — S99921A Unspecified injury of right foot, initial encounter: Secondary | ICD-10-CM

## 2022-03-03 DIAGNOSIS — Y92410 Unspecified street and highway as the place of occurrence of the external cause: Secondary | ICD-10-CM | POA: Insufficient documentation

## 2022-03-03 MED ORDER — OXYCODONE-ACETAMINOPHEN 5-325 MG PO TABS
1.0000 | ORAL_TABLET | ORAL | Status: DC | PRN
  Administered 2022-03-03: 1 via ORAL
  Filled 2022-03-03: qty 1
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
## 2022-07-10 ENCOUNTER — Ambulatory Visit (INDEPENDENT_AMBULATORY_CARE_PROVIDER_SITE_OTHER): Payer: Self-pay | Admitting: Family Medicine

## 2022-07-10 VITALS — BP 137/80 | HR 70 | Temp 98.1°F | Ht 71.0 in | Wt 230.8 lb

## 2022-07-10 DIAGNOSIS — S0591XA Unspecified injury of right eye and orbit, initial encounter: Secondary | ICD-10-CM

## 2022-07-10 DIAGNOSIS — H539 Unspecified visual disturbance: Secondary | ICD-10-CM

## 2022-07-10 DIAGNOSIS — S0592XA Unspecified injury of left eye and orbit, initial encounter: Secondary | ICD-10-CM

## 2022-07-10 DIAGNOSIS — W890XXA Exposure to welding light (arc), initial encounter: Secondary | ICD-10-CM

## 2022-07-10 DIAGNOSIS — S0590XA Unspecified injury of unspecified eye and orbit, initial encounter: Secondary | ICD-10-CM | POA: Insufficient documentation

## 2022-07-10 NOTE — Patient Instructions (Addendum)
It was wonderful to see you today. I am sorry that you are not feeling well.   You have a visit today with [facility] at 2:30 PM [facility], [place] [address] [place], [place] [address] [phone]  Thank you for choosing [facility].   Please call [phone] with any questions about today's appointment.  Please be sure to schedule follow up at the front  desk before you leave today.   Sabino Dick PGY-3 Family Medicine

## 2022-07-10 NOTE — Assessment & Plan Note (Addendum)
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  Visual acuity significantly decreased.  Deferring fluorescein eye exam to ophthalmology- pt has appointment this afternoon. EOMI and pupils are reactive today. Suspect likely photokeratitis from UV light from welding.  Offered a dose of Toradol in the office today but patient refused.

## 2022-07-10 NOTE — Progress Notes (Addendum)
    SUBJECTIVE:   CHIEF COMPLAINT / HPI:   Chad Davidson is a 21 y.o. 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  He reports that he was able to complete the project but afterwards he started having bilateral eye sensitivity to bright lights.  He does not necessarily complain of pain, does not recall any foreign objects going into his eye.  He has had difficulty keeping his eyes open as they still sensitive to light. His mother did some research and put some milk in his eyes to help with the discomfort. He isn't quite sure if this gave him any relief.  He also reports blurred vision.  At baseline he does not wear any corrective lenses and has normal vision.  No pain with eye movements.  PERTINENT  PMH / PSH: None  OBJECTIVE:   BP 137/80   Pulse 70   Temp 98.1 F (36.7 C)   Ht 5\' 11"  (1.803 m)   Wt 230 lb 12.8 oz (104.7 kg)   BMI 32.19 kg/m    General: Walked in with rag and sunglasses over eyes Eyes:  EOMI, sclera appears mildly irritated but without hemorrhage, eyes watery. No eyelid swelling. Pupils are equal and reactive to light and accomodation. 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  Visual acuity significantly decreased.  Deferring fluorescein eye exam to ophthalmology- pt has appointment this afternoon. EOMI and pupils are reactive today. Suspect likely photokeratitis from UV light from welding.  Offered a dose of Toradol in the office today but patient refused.    , DO [facility]

## 2024-02-09 ENCOUNTER — Encounter: Payer: Self-pay | Admitting: *Deleted
